# Patient Record
Sex: Male | Born: 1985 | Race: White | Hispanic: No | Marital: Single | Smoking: Never smoker
Health system: Southern US, Community
[De-identification: ages and names within clinical notes are randomized; demographics above are authoritative.]

## PROBLEM LIST (undated history)

## (undated) DIAGNOSIS — F329 Major depressive disorder, single episode, unspecified: Secondary | ICD-10-CM

## (undated) DIAGNOSIS — I10 Essential (primary) hypertension: Secondary | ICD-10-CM

## (undated) DIAGNOSIS — F909 Attention-deficit hyperactivity disorder, unspecified type: Secondary | ICD-10-CM

## (undated) DIAGNOSIS — F32A Depression, unspecified: Secondary | ICD-10-CM

## (undated) DIAGNOSIS — S43006A Unspecified dislocation of unspecified shoulder joint, initial encounter: Secondary | ICD-10-CM

## (undated) DIAGNOSIS — F840 Autistic disorder: Secondary | ICD-10-CM

## (undated) HISTORY — DX: Major depressive disorder, single episode, unspecified: F32.9

## (undated) HISTORY — DX: Essential (primary) hypertension: I10

## (undated) HISTORY — PX: FRACTURE SURGERY: SHX138

## (undated) HISTORY — DX: Attention-deficit hyperactivity disorder, unspecified type: F90.9

## (undated) HISTORY — DX: Autistic disorder: F84.0

## (undated) HISTORY — DX: Depression, unspecified: F32.A

---
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
## 2008-05-21 ENCOUNTER — Emergency Department (HOSPITAL_COMMUNITY): Admission: EM | Admit: 2008-05-21 | Discharge: 2008-05-21 | Payer: Self-pay | Admitting: Emergency Medicine

## 2010-10-17 NOTE — Op Note (Signed)
NAME:  Jimmy Ibarra, STOFKO                         ACCOUNT NO.:  [id number]   MEDICAL RECORD NO.:  [id number]                   PATIENT TYPE:  AMB   LOCATION:  DSC                                  FACILITY:  [facility]   PHYSICIAN:  Loreta Ave, M.D.              DATE OF BIRTH:  11-15-1985   DATE OF PROCEDURE:  02/02/2003  DATE OF DISCHARGE:                                 OPERATIVE REPORT   PREOPERATIVE DIAGNOSIS:  Nonunion, scaphoid, left wrist, with moderate  flexion deformity of scaphoid.   POSTOPERATIVE DIAGNOSIS:  Nonunion, scaphoid, left wrist, with moderate  flexion deformity of scaphoid, without avascular necrosis.   PROCEDURE:  Open reduction and internal fixation of nonunion of scaphoid  fracture, left wrist, with correction of deformity and fixation with a mini-  Acutrak screw 26 mm.   SURGEON:  Loreta Ave, M.D.   ASSISTANT:  Arlys John D. Petrarca, P.A.-C.   ANESTHESIA:  General.   ESTIMATED BLOOD LOSS:  Minimal.   TOURNIQUET TIME:  45 minutes.   SPECIMENS:  None.   CULTURES:  None.   COMPLICATIONS:  None.   DRESSING:  Soft compressive with thumb spica splint.   DESCRIPTION OF PROCEDURE:  Patient brought to the operating room and after  adequate anesthesia had been obtained, the left wrist examined.  Dorsiflexion to only 60 degrees because of the collapse and deformity.  Full  palmar flexion.  Tourniquet applied, prepped and draped in the usual sterile  fashion.  Exsanguinated with elevation and Esmarch and tourniquet inflated  to 250 mmHg.  A small curved incision over the volar aspect of the scaphoid.  Skin and subcutaneous tissue divided.  Neurovascular structures, tendon  units, radial artery identified and protected.  The capsule over the  scaphoid was opened.  Nonunion identified with no healing.  Both bone  fragments were viable.  Hypertrophic bone on both sides cleared off.  Deformity was corrected and then fixed with a guidewire from distal to  proximal.  Found to be in good position fluoroscopically.  Pre-drilled and  then fixed with a 26 mm Acutrak compression screw.  This corrected the  flexion deformity and also allowed good bony compression and contact, so I  did not need to add further bone graft.  At completion I saw the fixation.  Full passive motion.  Good alignment confirmed visually and  fluoroscopically.  The wound irrigated.  The wrist capsule closed with  Vicryl.  Skin with  nylon.  Margins of the wound injected with Marcaine.  A sterile compressive  dressing and thumb spica splint applied.  Tourniquet deflated and removed.  Anesthesia reversed.  Brought to the recovery room.  Tolerated the surgery  well with no complications.  Loreta Ave, M.D.    DFM/MEDQ  D:  02/01/2003  T:  02/02/2003  Job:  [id number]

## 2010-10-23 ENCOUNTER — Inpatient Hospital Stay (INDEPENDENT_AMBULATORY_CARE_PROVIDER_SITE_OTHER)
Admission: RE | Admit: 2010-10-23 | Discharge: 2010-10-23 | Disposition: A | Discharge status: Home / Self Care | Payer: 59 | Source: Ambulatory Visit | Attending: Family Medicine | Admitting: Family Medicine

## 2010-10-23 DIAGNOSIS — J309 Allergic rhinitis, unspecified: Secondary | ICD-10-CM

## 2013-04-01 ENCOUNTER — Emergency Department (HOSPITAL_COMMUNITY): Payer: Self-pay

## 2013-04-01 ENCOUNTER — Encounter (HOSPITAL_COMMUNITY): Payer: Self-pay | Admitting: Emergency Medicine

## 2013-04-01 ENCOUNTER — Emergency Department (HOSPITAL_COMMUNITY)
Admission: EM | Admit: 2013-04-01 | Discharge: 2013-04-01 | Disposition: A | Discharge status: Home / Self Care | Payer: Self-pay | Attending: Emergency Medicine | Admitting: Emergency Medicine

## 2013-04-01 ENCOUNTER — Emergency Department (HOSPITAL_COMMUNITY): Payer: 59

## 2013-04-01 DIAGNOSIS — Y9389 Activity, other specified: Secondary | ICD-10-CM | POA: Insufficient documentation

## 2013-04-01 DIAGNOSIS — W108XXA Fall (on) (from) other stairs and steps, initial encounter: Secondary | ICD-10-CM | POA: Insufficient documentation

## 2013-04-01 DIAGNOSIS — W010XXA Fall on same level from slipping, tripping and stumbling without subsequent striking against object, initial encounter: Secondary | ICD-10-CM | POA: Insufficient documentation

## 2013-04-01 DIAGNOSIS — Y929 Unspecified place or not applicable: Secondary | ICD-10-CM | POA: Insufficient documentation

## 2013-04-01 DIAGNOSIS — S43014A Anterior dislocation of right humerus, initial encounter: Secondary | ICD-10-CM

## 2013-04-01 DIAGNOSIS — S43016A Anterior dislocation of unspecified humerus, initial encounter: Secondary | ICD-10-CM | POA: Insufficient documentation

## 2013-04-01 MED ORDER — PROPOFOL 10 MG/ML IV BOLUS: 0.5000 mg/kg | Freq: Once | INTRAVENOUS | Status: DC

## 2013-04-01 MED ORDER — FENTANYL CITRATE 0.05 MG/ML IJ SOLN
100.0000 ug | Freq: Once | INTRAMUSCULAR | Status: AC
  Administered 2013-04-01: 100 ug via INTRAVENOUS

## 2013-04-01 MED ORDER — FENTANYL CITRATE 0.05 MG/ML IJ SOLN
100.0000 ug | Freq: Once | INTRAMUSCULAR | Status: AC
  Administered 2013-04-01: 100 ug via INTRAVENOUS
  Filled 2013-04-01: qty 2

## 2013-04-01 MED ORDER — OXYCODONE-ACETAMINOPHEN 5-325 MG PO TABS: 1.0000 | ORAL_TABLET | ORAL | Status: DC | PRN

## 2013-04-01 MED ORDER — PROPOFOL 10 MG/ML IV BOLUS
INTRAVENOUS | Status: AC | PRN
  Administered 2013-04-01 (×2): 30 mg via INTRAVENOUS
  Administered 2013-04-01: 50 mg via INTRAVENOUS

## 2013-04-01 MED ORDER — FENTANYL CITRATE 0.05 MG/ML IJ SOLN
INTRAMUSCULAR | Status: AC
  Filled 2013-04-01: qty 2

## 2013-04-01 MED ORDER — PROPOFOL 10 MG/ML IV BOLUS
INTRAVENOUS | Status: AC
  Filled 2013-04-01: qty 20

## 2013-04-01 NOTE — ED Notes (Signed)
Patient transported to X-ray 

## 2013-04-01 NOTE — ED Provider Notes (Signed)
27 year old male of fall down steps and injured his right shoulder and elbow. Exam shows from in the right shoulder consistent with anterior dislocation. Distal neurovascular exam is intact. He resists any movement at the elbow limiting exam. X-ray confirms anterior dislocation of the shoulder.  Procedural sedation Performed by: ZOXWR,UEAVW Consent: Verbal consent obtained. Risks and benefits: risks, benefits and alternatives were discussed Required items: required blood products, implants, devices, and special equipment available Patient identity confirmed: arm band and provided demographic data Time out: Immediately prior to procedure a "time out" was called to verify the correct patient, procedure, equipment, support staff and site/side marked as required.  Sedation type: moderate (conscious) sedation NPO time confirmed and considedered  Sedatives: PROPOFOL  Physician Time at Bedside: 30 minutes  Vitals: Vital signs were monitored during sedation. Cardiac Monitor, pulse oximeter Patient tolerance: Patient tolerated the procedure well with no immediate complications. Comments: Pt with uneventful recovered. Returned to pre-procedural sedation baseline  After informed consent was obtained, preprocedure timeout was held. He was sedated with propofol and right shoulder dislocation was reduced by manipulation. Postreduction, and shoulder deformity was no longer present and distal neurovascular exam is intact. He is placed in a shoulder immobilizer and sent for postreduction x-rays as well as x-rays of the right elbow which had not been able to be obtained earlier.  X-rays confirm reduction although L. Sacks deformity is present. I will x-ray shows no evidence of fracture. Images are viewed by me. He is discharged with prescription for oxycodone-acetaminophen and is to use OTC medications for less severe pain. He is referred to orthopedics for followup.  Medical screening  examination/treatment/procedure(s) were conducted as a shared visit with non-physician practitioner(s) and myself.  I personally evaluated the patient during the encounter.   Dione Booze, MD 04/01/13 [phone]

## 2013-04-01 NOTE — ED Provider Notes (Signed)
CSN: [id number]     Arrival date & time 04/01/13  0344 History   First MD Initiated Contact with Patient 04/01/13 0416     Chief Complaint  Patient presents with  . Arm Injury   (Consider location/radiation/quality/duration/timing/severity/associated sxs/prior Treatment) HPI Comments: Patient is a 27 year old male presenting to the emergency department for severe sharp right shoulder pain with radiation down the arm that occurred after patient slipped on stairs and fell on his shoulder earlier this evening. Patient states he lost his footing on cement steps in the rain causing him to fall. He denies any loss of consciousness, headache, vomiting. He denies any numbness in the right upper extremity. He states his pain is worsened with movement. Denies any alleviating factors. Patient is left-handed. Denies previous history of shoulder dislocations or injuries.   Patient is a 27 y.o. male presenting with arm injury.  Arm Injury Associated symptoms: no fever     History reviewed. No pertinent past medical history. Past Surgical History  Procedure Laterality Date  . Fracture surgery     No family history on file. History  Substance Use Topics  . Smoking status: Never Smoker   . Smokeless tobacco: Not on file  . Alcohol Use: Yes     Comment: socially    Review of Systems  Constitutional: Negative for fever.  Gastrointestinal: Negative for vomiting.  Musculoskeletal: Positive for arthralgias and myalgias.  Skin: Negative for wound.  Neurological: Negative for syncope and headaches.  All other systems reviewed and are negative.    Allergies  Review of patient's allergies indicates no known allergies.  Home Medications   Current Outpatient Rx  Name  Route  Sig  Dispense  Refill  . ibuprofen (ADVIL,MOTRIN) 200 MG tablet   Oral   Take 200 mg by mouth every 6 (six) hours as needed for pain.          BP 164/92  Pulse 81  Temp(Src) 98.1 F (36.7 C) (Oral)  Resp 24  Ht 6'  3" (1.905 m)  Wt 192 lb (87.091 kg)  BMI 24 kg/m2  SpO2 95% Physical Exam  Constitutional: He is oriented to person, place, and time. He appears well-developed and well-nourished. No distress.  HENT:  Head: Normocephalic and atraumatic.  Right Ear: External ear normal.  Left Ear: External ear normal.  Nose: Nose normal.  Mouth/Throat: Oropharynx is clear and moist.  Eyes: Conjunctivae are normal.  Neck: Normal range of motion. Neck supple.  Cardiovascular: Normal rate, regular rhythm, normal heart sounds and intact distal pulses.   Pulmonary/Chest: Effort normal and breath sounds normal. No respiratory distress.  Abdominal: Soft. There is no tenderness.  Musculoskeletal:       Right shoulder: He exhibits decreased range of motion, tenderness, bony tenderness, deformity, pain and decreased strength. He exhibits no effusion, no crepitus and normal pulse.       Left shoulder: Normal.       Right elbow: He exhibits decreased range of motion. Tenderness found.       Left elbow: Normal.       Right wrist: Normal.       Left wrist: Normal.       Right upper arm: He exhibits tenderness and deformity.       Right forearm: Normal.       Right hand: Normal. Normal sensation noted. Normal strength noted.  Neurological: He is alert and oriented to person, place, and time.  Skin: Skin is warm and dry. He is not  diaphoretic.  Psychiatric: He has a normal mood and affect.    ED Course  Procedures (including critical care time)  Medications  propofol (DIPRIVAN) 10 mg/mL bolus/IV push 43.6 mg (not administered)  fentaNYL (SUBLIMAZE) injection 100 mcg (100 mcg Intravenous Given 04/01/13 0440)  fentaNYL (SUBLIMAZE) injection 100 mcg (100 mcg Intravenous Given 04/01/13 0519)  propofol (DIPRIVAN) 10 mg/mL bolus/IV push ( Intravenous Stopped 04/01/13 0610)    Labs Review Labs Reviewed - No data to display Imaging Review Dg Shoulder Right  04/01/2013   CLINICAL DATA:  Fall at work with arm pain.   EXAM: RIGHT SHOULDER - 2+ VIEW  COMPARISON:  None.  FINDINGS: Anterior shoulder dislocation. There is a Hill-Sachs deformity. No evidence of acute trauma to the right chest.  IMPRESSION: Anterior shoulder dislocation with Hill-Sachs fracture.   Electronically Signed   By: Tiburcio Pea M.D.   On: 04/01/2013 05:21    EKG Interpretation   None       MDM   1. Anterior shoulder dislocation, right, initial encounter     Neurovascularly intact. Normal sensation. Patient with R anterior shoulder dislocation w/ Hill-Sachs fracture. Dr. Preston Fleeting will consciously sedate patient and reduce dislocation. Will obtain post procedure films to ensure proper reduction, along with R elbow films since patient is complaining of pain and exam is limited d/t pain. Dr. Preston Fleeting has written a procedural note for reduction. Sling immobilizer will be placed.   Post reduction films pending at time of shift change, along with R elbow films. Disposition pending post reduction films. Signed out to [employer], PA-C. Patient d/w with Dr. Preston Fleeting, agrees with plan.       Jeannetta Ellis, PA-C 04/01/13 [phone]

## 2013-04-01 NOTE — ED Notes (Signed)
Pt brought back from Xray  

## 2013-04-01 NOTE — ED Notes (Signed)
Pt states he slipped on some stairs and fell landing on his bottom with his right arm twisted behind him.  No having pain moving his arm at his elbow and shoulder

## 2013-04-01 NOTE — ED Notes (Signed)
R shoulder reduced. Pt now resting. Answers to questions.

## 2014-06-09 ENCOUNTER — Encounter (HOSPITAL_COMMUNITY): Payer: Self-pay | Admitting: Emergency Medicine

## 2014-06-09 ENCOUNTER — Emergency Department (HOSPITAL_COMMUNITY): Payer: Self-pay

## 2014-06-09 ENCOUNTER — Emergency Department (HOSPITAL_COMMUNITY)
Admission: EM | Admit: 2014-06-09 | Discharge: 2014-06-09 | Disposition: A | Discharge status: Home / Self Care | Payer: Self-pay | Attending: Emergency Medicine | Admitting: Emergency Medicine

## 2014-06-09 DIAGNOSIS — Y9289 Other specified places as the place of occurrence of the external cause: Secondary | ICD-10-CM | POA: Insufficient documentation

## 2014-06-09 DIAGNOSIS — S43004A Unspecified dislocation of right shoulder joint, initial encounter: Secondary | ICD-10-CM | POA: Insufficient documentation

## 2014-06-09 DIAGNOSIS — R52 Pain, unspecified: Secondary | ICD-10-CM

## 2014-06-09 DIAGNOSIS — Z9889 Other specified postprocedural states: Secondary | ICD-10-CM | POA: Insufficient documentation

## 2014-06-09 DIAGNOSIS — X58XXXA Exposure to other specified factors, initial encounter: Secondary | ICD-10-CM | POA: Insufficient documentation

## 2014-06-09 DIAGNOSIS — Y9389 Activity, other specified: Secondary | ICD-10-CM | POA: Insufficient documentation

## 2014-06-09 DIAGNOSIS — Y998 Other external cause status: Secondary | ICD-10-CM | POA: Insufficient documentation

## 2014-06-09 DIAGNOSIS — M25519 Pain in unspecified shoulder: Secondary | ICD-10-CM

## 2014-06-09 HISTORY — DX: Unspecified dislocation of unspecified shoulder joint, initial encounter: S43.006A

## 2014-06-09 MED ORDER — HYDROCODONE-ACETAMINOPHEN 5-325 MG PO TABS: 1.0000 | ORAL_TABLET | Freq: Four times a day (QID) | ORAL | Status: DC | PRN

## 2014-06-09 MED ORDER — MORPHINE SULFATE 4 MG/ML IJ SOLN
4.0000 mg | Freq: Once | INTRAMUSCULAR | Status: AC
  Administered 2014-06-09: 4 mg via INTRAVENOUS
  Filled 2014-06-09: qty 1

## 2014-06-09 MED ORDER — ETOMIDATE 2 MG/ML IV SOLN
0.2000 mg/kg | Freq: Once | INTRAVENOUS | Status: AC
  Administered 2014-06-09: 17 mg via INTRAVENOUS
  Filled 2014-06-09: qty 10

## 2014-06-09 MED ORDER — NAPROXEN 500 MG PO TABS: 500.0000 mg | ORAL_TABLET | Freq: Two times a day (BID) | ORAL | Status: DC

## 2014-06-09 NOTE — ED Provider Notes (Signed)
CSN: [id number]     Arrival date & time 06/09/14  0110 History   First MD Initiated Contact with Patient 06/09/14 0157     Chief Complaint  Patient presents with  . Shoulder Injury     HPI Patient presents to the emergency department complaining of pain and deformity of right shoulder.  He has a history of right shoulder dislocation.  He states that he sneezed and his right shoulder came out of joint.  He has moderate pain at this time.  He never followed up with orthopedic surgery last time.  His previous dislocation was a traumatic dislocation after fall downstairs.  Denies numbness or tingling in his right upper extremity.  Pain with range of motion and palpation of his right shoulder.  Obvious deformity noted of his right shoulder.   Past Medical History  Diagnosis Date  . Shoulder dislocation    Past Surgical History  Procedure Laterality Date  . Fracture surgery     No family history on file. History  Substance Use Topics  . Smoking status: Never Smoker   . Smokeless tobacco: Not on file  . Alcohol Use: Yes     Comment: socially    Review of Systems  All other systems reviewed and are negative.     Allergies  Review of patient's allergies indicates no known allergies.  Home Medications   Prior to Admission medications   Medication Sig Start Date End Date Taking? Authorizing Provider  HYDROcodone-acetaminophen (NORCO/VICODIN) 5-325 MG per tablet Take 1 tablet by mouth every 6 (six) hours as needed for moderate pain. 06/09/14   Lyanne Co, MD  ibuprofen (ADVIL,MOTRIN) 200 MG tablet Take 200 mg by mouth every 6 (six) hours as needed for pain.    Historical Provider, MD  naproxen (NAPROSYN) 500 MG tablet Take 1 tablet (500 mg total) by mouth 2 (two) times daily. 06/09/14   Lyanne Co, MD  oxyCODONE-acetaminophen (PERCOCET) 5-325 MG per tablet Take 1 tablet by mouth every 4 (four) hours as needed for pain. 04/01/13   Dione Booze, MD   BP 175/113 mmHg  Pulse 70   Temp(Src) 98.1 F (36.7 C) (Oral)  Resp 16  Wt 194 lb (87.998 kg)  SpO2 99% Physical Exam  Constitutional: He is oriented to person, place, and time. He appears well-developed and well-nourished.  HENT:  Head: Normocephalic and atraumatic.  Eyes: EOM are normal.  Neck: Normal range of motion.  Cardiovascular: Normal rate, regular rhythm, normal heart sounds and intact distal pulses.   Pulmonary/Chest: Effort normal and breath sounds normal. No respiratory distress.  Abdominal: Soft. He exhibits no distension. There is no tenderness.  Musculoskeletal: Normal range of motion.  Deformity of right shoulder consistent with anterior right shoulder dislocation.  Normal right radial pulse.  Normal sensation in the lateral aspect of the shoulder.  Neurological: He is alert and oriented to person, place, and time.  Skin: Skin is warm and dry.  Psychiatric: He has a normal mood and affect. Judgment normal.  Nursing note and vitals reviewed.   ED Course  Procedures    Procedural sedation Performed by: Lyanne Co Consent: Verbal consent obtained. Risks and benefits: risks, benefits and alternatives were discussed Required items: required blood products, implants, devices, and special equipment available Patient identity confirmed: arm band and provided demographic data Time out: Immediately prior to procedure a "time out" was called to verify the correct patient, procedure, equipment, support staff and site/side marked as required. Sedation type: moderate (conscious)  sedation NPO time confirmed and considedered Sedatives: ETOMIDATE Physician Time at Bedside: 15 Vitals: Vital signs were monitored during sedation. Cardiac Monitor, pulse oximeter Patient tolerance: Patient tolerated the procedure well with no immediate complications. Comments: Pt with uneventful recovered. Returned to pre-procedural sedation baseline   Reduction of dislocation Performed by: Lyanne CoAMPOS,Kodee Ravert M Consent:  Verbal consent obtained. Risks and benefits: risks, benefits and alternatives were discussed Consent given by: patient Required items: required blood products, implants, devices, and special equipment available Time out: Immediately prior to procedure a "time out" was called to verify the correct patient, procedure, equipment, support staff and site/side marked as required. Patient sedated: etomidate Vitals: Vital signs were monitored during sedation. Patient tolerance: Patient tolerated the procedure well with no immediate complications. Joint: right shoulder Reduction technique: manipulation    Labs Review Labs Reviewed - No data to display  Imaging Review Dg Shoulder Right  06/09/2014   CLINICAL DATA:  Patient sneezed at 12:30 a.m. today and dislocated is right shoulder. History of previous dislocations.  EXAM: RIGHT SHOULDER - 2+ VIEW  COMPARISON:  04/01/2013  FINDINGS: Anterior dislocation of the right humeral head with respect to the glenoid. Old appearing Hill-Sachs deformity. No definite acute fracture identified. Coracoclavicular and acromioclavicular spaces are maintained very  IMPRESSION: Anterior dislocation of the right shoulder.   Electronically Signed   By: Burman NievesWilliam  Stevens M.D.   On: 06/09/2014 01:56   Dg Shoulder Right Port  06/09/2014   CLINICAL DATA:  Postreduction right shoulder dislocation today.  EXAM: PORTABLE RIGHT SHOULDER - 2+ VIEW  COMPARISON:  06/09/2014  FINDINGS: Interval relocation of right shoulder postreduction. Hill-Sachs defect in the lateral humeral head appears to be chronic. No definite evidence of any acute fractures.  IMPRESSION: Interval relocation of previous right shoulder dislocation.   Electronically Signed   By: Burman NievesWilliam  Stevens M.D.   On: 06/09/2014 03:17  I personally reviewed the imaging tests through PACS system I reviewed available ER/hospitalization records through the EMR    EKG Interpretation None      MDM   Final diagnoses:   Shoulder pain  Shoulder dislocation, right, initial encounter   Reduction of dislocated right shoulder without complication.  Patient tolerated procedural sedation well.  Discharge home with primary care and orthopedic follow-up.  He understands to return to the ER for new or worsening symptoms.    Lyanne CoKevin M Joia Doyle, MD 06/09/14 [phone]

## 2014-06-09 NOTE — ED Notes (Signed)
Pt. Given ice pack. 

## 2014-06-09 NOTE — Sedation Documentation (Signed)
Ice Pack applied to shoulder and right clavicle.

## 2014-06-09 NOTE — Discharge Instructions (Signed)
Shoulder Dislocation  Your shoulder is made up of three bones: the collar bone (clavicle); the shoulder blade (scapula), which includes the socket (glenoid cavity); and the upper arm bone (humerus). Your shoulder joint is the place where these bones meet. Strong, fibrous tissues hold these bones together (ligaments). Muscles and strong, fibrous tissues that connect the muscles to these bones (tendons) allow your arm to move through this joint. The range of motion of your shoulder joint is more extensive than most of your other joints, and the glenoid cavity is very shallow. That is the reason that your shoulder joint is one of the most unstable joints in your body. It is far more prone to dislocation than your other joints. Shoulder dislocation is when your humerus is forced out of your shoulder joint.  CAUSES  Shoulder dislocation is caused by a forceful impact on your shoulder. This impact usually is from an injury, such as a sports injury or a fall.  SYMPTOMS  Symptoms of shoulder dislocation include:  · Deformity of your shoulder.  · Intense pain.  · Inability to move your shoulder joint.  · Numbness, weakness, or tingling around your shoulder joint (your neck or down your arm).  · Bruising or swelling around your shoulder.  DIAGNOSIS  In order to diagnose a dislocated shoulder, your caregiver will perform a physical exam. Your caregiver also may have an X-ray exam done to see if you have any broken bones. Magnetic resonance imaging (MRI) is a procedure that sometimes is done to help your caregiver see any damage to the soft tissues around your shoulder, particularly your rotator cuff tendons. Additionally, your caregiver also may have electromyography done to measure the electrical discharges produced in your muscles if you have signs or symptoms of nerve damage.  TREATMENT  A shoulder dislocation is treated by placing the humerus back in the joint (reduction). Your caregiver does this either manually (closed  reduction), by moving your humerus back into the joint through manipulation, or through surgery (open reduction). When your humerus is back in place, severe pain should improve almost immediately.  You also may need to have surgery if you have a weak shoulder joint or ligaments, and you have recurring shoulder dislocations, despite rehabilitation. In rare cases, surgery is necessary if your nerves or blood vessels are damaged during the dislocation.  After your reduction, your arm will be placed in a shoulder immobilizer or sling to keep it from moving. Your caregiver will have you wear your shoulder immobilizer or sling for 3 days to 3 weeks, depending on how serious your dislocation is. When your shoulder immobilizer or sling is removed, your caregiver may prescribe physical therapy to help improve the range of motion in your shoulder joint.  HOME CARE INSTRUCTIONS   The following measures can help to reduce pain and speed up the healing process:  · Rest your injured joint. Do not move it. Avoid activities similar to the one that caused your injury.  · Apply ice to your injured joint for the first day or two after your reduction or as directed by your caregiver. Applying ice helps to reduce inflammation and pain.  ¨ Put ice in a plastic bag.  ¨ Place a towel between your skin and the bag.  ¨ Leave the ice on for 15-20 minutes at a time, every 2 hours while you are awake.  · Exercise your hand by squeezing a soft ball. This helps to eliminate stiffness and swelling in your hand and   wrist.  · Take over-the-counter or prescription medicine for pain or discomfort as told by your caregiver.  SEEK IMMEDIATE MEDICAL CARE IF:   · Your shoulder immobilizer or sling becomes damaged.  · Your pain becomes worse rather than better.  · You lose feeling in your arm or hand, or they become white and cold.  MAKE SURE YOU:   · Understand these instructions.  · Will watch your condition.  · Will get help right away if you are not  doing well or get worse.  Document Released: 02/10/2001 Document Revised: 10/02/2013 Document Reviewed: 03/08/2011  [employer]® Patient Information ©2015 [employer]. This information is not intended to replace advice given to you by your health care provider. Make sure you discuss any questions you have with your health care provider.

## 2014-06-09 NOTE — ED Notes (Signed)
Pt alert and oriented x 4. Mother driving pt home.

## 2014-06-09 NOTE — ED Notes (Addendum)
Mother at bedside. Patient alert and oriented x 4. NSR 72 on cardiac monitor.

## 2014-06-09 NOTE — ED Notes (Addendum)
Pt states he was lying flat on stomach arms straight up and sneezed, deformity noted to R shoulder.  Pt states he has had dislocation in the past, Diprivan did not work, he was conscious for entire procedure.

## 2017-01-19 ENCOUNTER — Emergency Department (HOSPITAL_COMMUNITY)
Admission: EM | Admit: 2017-01-19 | Discharge: 2017-01-20 | Disposition: A | Discharge status: Home / Self Care | Payer: Self-pay | Attending: Emergency Medicine | Admitting: Emergency Medicine

## 2017-01-19 ENCOUNTER — Encounter (HOSPITAL_COMMUNITY): Payer: Self-pay | Admitting: *Deleted

## 2017-01-19 ENCOUNTER — Emergency Department (HOSPITAL_COMMUNITY): Payer: Self-pay

## 2017-01-19 DIAGNOSIS — Y9389 Activity, other specified: Secondary | ICD-10-CM | POA: Insufficient documentation

## 2017-01-19 DIAGNOSIS — S43004A Unspecified dislocation of right shoulder joint, initial encounter: Secondary | ICD-10-CM | POA: Insufficient documentation

## 2017-01-19 DIAGNOSIS — X509XXA Other and unspecified overexertion or strenuous movements or postures, initial encounter: Secondary | ICD-10-CM | POA: Insufficient documentation

## 2017-01-19 DIAGNOSIS — Y929 Unspecified place or not applicable: Secondary | ICD-10-CM | POA: Insufficient documentation

## 2017-01-19 DIAGNOSIS — Y998 Other external cause status: Secondary | ICD-10-CM | POA: Insufficient documentation

## 2017-01-19 MED ORDER — HYDROMORPHONE HCL 1 MG/ML IJ SOLN
1.0000 mg | Freq: Once | INTRAMUSCULAR | Status: AC
  Administered 2017-01-19: 1 mg via INTRAVENOUS
  Filled 2017-01-19: qty 1

## 2017-01-19 MED ORDER — FENTANYL CITRATE (PF) 100 MCG/2ML IJ SOLN
50.0000 ug | Freq: Once | INTRAMUSCULAR | Status: AC
  Administered 2017-01-19: 50 ug via INTRAVENOUS
  Filled 2017-01-19: qty 2

## 2017-01-19 MED ORDER — FENTANYL CITRATE (PF) 100 MCG/2ML IJ SOLN
50.0000 ug | INTRAMUSCULAR | Status: DC | PRN
  Administered 2017-01-19: 50 ug via NASAL

## 2017-01-19 MED ORDER — HYDROMORPHONE HCL 1 MG/ML IJ SOLN
1.0000 mg | Freq: Once | INTRAMUSCULAR | Status: AC
Start: 2017-01-19 — End: 2017-01-19
  Administered 2017-01-19: 1 mg via INTRAMUSCULAR
  Filled 2017-01-19: qty 1

## 2017-01-19 MED ORDER — PROPOFOL 10 MG/ML IV BOLUS
0.5000 mg/kg | Freq: Once | INTRAVENOUS | Status: DC
  Filled 2017-01-19: qty 20

## 2017-01-19 MED ORDER — FENTANYL CITRATE (PF) 100 MCG/2ML IJ SOLN
INTRAMUSCULAR | Status: AC
  Administered 2017-01-19: 50 ug via NASAL
  Filled 2017-01-19: qty 2

## 2017-01-19 NOTE — ED Notes (Signed)
Warm blanket applied.  Patient relaxing more and more, states pain is reducing.

## 2017-01-19 NOTE — ED Notes (Signed)
MD at bedside. 

## 2017-01-19 NOTE — ED Notes (Signed)
Code cart, airway cart at bedside.  Signed consent complete.  Pt placed on CO2 monitor.

## 2017-01-19 NOTE — ED Notes (Signed)
Patient screaming out of room.  IV became dislodged and trickled water in to bed.  Pt continues to endorse 9/10 pain.

## 2017-01-19 NOTE — ED Provider Notes (Signed)
[facility]-EMERGENCY DEPT Provider Note   CSN: [id number] Arrival date & time: 01/19/17  1859     History   Chief Complaint Chief Complaint  Patient presents with  . Shoulder Pain    HPI Jimmy Ibarra is a 31 y.o. male.  Patient presents with right shoulder dislocation.  He states he was lying on his stomach with his arms outstretched above his head when he sneezed, he felt a pop in his right shoulder and decreased range of motion He states in January 2016 he dislocated his shoulderin the same manner.      Past Medical History:  Diagnosis Date  . Shoulder dislocation     There are no active problems to display for this patient.   Past Surgical History:  Procedure Laterality Date  . FRACTURE SURGERY         Home Medications    Prior to Admission medications   Medication Sig Start Date End Date Taking? Authorizing Provider  ibuprofen (ADVIL,MOTRIN) 200 MG tablet Take 200 mg by mouth every 6 (six) hours as needed for pain.   Yes [provider]  HYDROcodone-acetaminophen (NORCO/VICODIN) 5-325 MG per tablet Take 1 tablet by mouth every 6 (six) hours as needed for moderate pain. Patient not taking: Reported on 01/19/2017 06/09/14   Azalia Bilis, MD  naproxen (NAPROSYN) 500 MG tablet Take 1 tablet (500 mg total) by mouth 2 (two) times daily. Patient not taking: Reported on 01/19/2017 06/09/14   Azalia Bilis, MD  oxyCODONE-acetaminophen (PERCOCET) 5-325 MG per tablet Take 1 tablet by mouth every 4 (four) hours as needed for pain. Patient not taking: Reported on 06/09/2014 04/01/13   Dione Booze, MD    Family History No family history on file.  Social History Social History  Substance Use Topics  . Smoking status: Never Smoker  . Smokeless tobacco: Not on file  . Alcohol use Yes     Comment: socially     Allergies   Patient has no known allergies.   Review of Systems Review of Systems  Musculoskeletal: Positive for joint swelling.  Neurological:  Negative for numbness.  All other systems reviewed and are negative.    Physical Exam Updated Vital Signs BP (!) 188/140   Pulse 85   Temp 98.6 F (37 C) (Oral)   Resp 15   Ht 6\' 2"  (1.88 m)   Wt 90.7 kg (200 lb)   SpO2 97%   BMI 25.68 kg/m   Physical Exam  Constitutional: He appears well-developed and well-nourished.  HENT:  Head: Normocephalic.  Eyes: Pupils are equal, round, and reactive to light.  Neck: Normal range of motion.  Cardiovascular: Normal rate.   Pulmonary/Chest: Effort normal.  Musculoskeletal: He exhibits tenderness and deformity.       Arms: Neurological: He is alert.  Skin: Skin is warm. He is diaphoretic.  Nursing note and vitals reviewed.    ED Treatments / Results  Labs (all labs ordered are listed, but only abnormal results are displayed) Labs Reviewed - No data to display  EKG  EKG Interpretation None       Radiology Dg Shoulder Right  Result Date: 01/20/2017 CLINICAL DATA:  Post reduction radiographs of the right shoulder status post dislocation. EXAM: RIGHT SHOULDER - 2+ VIEW COMPARISON:  01/19/2017 FINDINGS: Satisfactory reduction of the humeral head which articulates with the glenoid. The [facility] joint is maintained. Large Hill-Sachs deformity is noted. No bony Bankart lesion is identified. The adjacent ribs and lung are nonacute. IMPRESSION: Satisfactory reduction  of humeral head dislocation. Hill-Sachs deformity of the humeral head is noted. No bony Bankart lesions are apparent. Electronically Signed   By: Tollie Eth M.D.   On: 01/20/2017 01:09   Dg Shoulder Right  Result Date: 01/19/2017 CLINICAL DATA:  Pain and possible right shoulder dislocation. EXAM: RIGHT SHOULDER - 2+ VIEW COMPARISON:  06/09/2014 FINDINGS: There is a right anterior dislocation of the humeral head with respect to the glenoid. Chronic appearing Hill-Sachs deformity. No definite acute fracture seen. Coracoclavicular and acromioclavicular spaces are maintained.  IMPRESSION: Anterior right shoulder dislocation. Chronic appearing Hill-Sachs deformity. No definite acute fractures seen. Electronically Signed   By: Ted Mcalpine M.D.   On: 01/19/2017 19:47    Procedures Reduction of dislocation Date/Time: 01/20/2017 12:14 AM Performed by: Earley Favor Authorized by: Earley Favor  Consent: Verbal consent obtained. Consent given by: patient Patient understanding: patient states understanding of the procedure being performed Patient consent: the patient's understanding of the procedure matches consent given Procedure consent: procedure consent matches procedure scheduled Relevant documents: relevant documents present and verified Test results: test results available and properly labeled Site marked: the operative site was marked Imaging studies: imaging studies available Patient identity confirmed: verbally with patient Time out: Immediately prior to procedure a "time out" was called to verify the correct patient, procedure, equipment, support staff and site/side marked as required. Local anesthesia used: no  Anesthesia: Local anesthesia used: no  Sedation: Patient sedated: yes Sedatives: propofol Analgesia: hydromorphone Sedation start date/time: 01/20/2017 12:11 AM Sedation end date/time: 01/20/2017 12:18 AM Vitals: Vital signs were monitored during sedation. Patient tolerance: Patient tolerated the procedure well with no immediate complications    (including critical care time)  Medications Ordered in ED Medications  fentaNYL (SUBLIMAZE) injection 50 mcg (50 mcg Intravenous Given 01/19/17 2013)  HYDROmorphone (DILAUDID) injection 1 mg (1 mg Intravenous Given 01/19/17 2059)  HYDROmorphone (DILAUDID) injection 1 mg (1 mg Intramuscular Given 01/19/17 2148)  propofol (DIPRIVAN) 10 mg/mL bolus/IV push (100 mg Intravenous Given 01/20/17 0008)     Initial Impression / Assessment and Plan / ED Course  I have reviewed the triage vital signs  and the nursing notes.  Pertinent labs & imaging results that were available during my care of the patient were reviewed by me and considered in my medical decision making (see chart for details).        Final Clinical Impressions(s) / ED Diagnoses   Final diagnoses:  Shoulder dislocation, right, initial encounter    New Prescriptions Discharge Medication List as of 01/20/2017  1:26 AM       Earley Favor, NP 01/20/17 1944    Loren Racer, MD 01/22/17 1515

## 2017-01-19 NOTE — ED Triage Notes (Signed)
Pt states that he sneezed and he dislocated his right shoulder. Prev history of 2 prior shoulder dislocations. Pt took what he thinks was ibuprofen for pain PTA without relief.

## 2017-01-19 NOTE — ED Notes (Signed)
With NP present, patient turned on to stomach and saline bag with curlex around pt's right forearm, positioned patient with shoulder at line of bed, and hand not touching the ground.  Pt's mother in room at this time, and assisting patient to relax trapezius muscle.

## 2017-01-20 ENCOUNTER — Emergency Department (HOSPITAL_COMMUNITY): Payer: Self-pay

## 2017-01-20 MED ORDER — PROPOFOL 10 MG/ML IV BOLUS
INTRAVENOUS | Status: AC | PRN
  Administered 2017-01-20: 100 mg via INTRAVENOUS

## 2017-01-20 NOTE — ED Notes (Signed)
Patient able to ambulate independently  

## 2017-01-20 NOTE — ED Notes (Signed)
Pt at xray

## 2017-01-20 NOTE — Discharge Instructions (Signed)
It is important that you see the orthopedic doctor to discuss surgical repair of your should as it has been dislocated several times

## 2017-01-20 NOTE — ED Notes (Signed)
Patient's mother back at bedside.  Updated.

## 2018-03-21 ENCOUNTER — Encounter (HOSPITAL_COMMUNITY): Payer: Self-pay

## 2018-03-21 ENCOUNTER — Encounter (HOSPITAL_COMMUNITY): Payer: Self-pay | Admitting: Emergency Medicine

## 2018-03-21 ENCOUNTER — Other Ambulatory Visit: Payer: Self-pay

## 2018-03-21 ENCOUNTER — Emergency Department (HOSPITAL_COMMUNITY)
Admission: EM | Admit: 2018-03-21 | Discharge: 2018-03-21 | Disposition: A | Discharge status: Home / Self Care | Payer: Self-pay | Attending: Emergency Medicine | Admitting: Emergency Medicine

## 2018-03-21 ENCOUNTER — Ambulatory Visit (HOSPITAL_COMMUNITY)
Admission: EM | Admit: 2018-03-21 | Discharge: 2018-03-21 | Disposition: A | Discharge status: Home / Self Care | Payer: Self-pay

## 2018-03-21 DIAGNOSIS — I10 Essential (primary) hypertension: Secondary | ICD-10-CM | POA: Insufficient documentation

## 2018-03-21 DIAGNOSIS — Z79899 Other long term (current) drug therapy: Secondary | ICD-10-CM | POA: Insufficient documentation

## 2018-03-21 LAB — BASIC METABOLIC PANEL
Anion gap: 9 (ref 5–15)
BUN: 12 mg/dL (ref 6–20)
CALCIUM: 9.1 mg/dL (ref 8.9–10.3)
CO2: 30 mmol/L (ref 22–32)
CREATININE: 1.05 mg/dL (ref 0.61–1.24)
Chloride: 100 mmol/L (ref 98–111)
GFR calc Af Amer: >60 mL/min (ref >60)
GLUCOSE: 100 mg/dL — AB (ref 70–99)
POTASSIUM: 3.2 mmol/L — AB (ref 3.5–5.1)
SODIUM: 139 mmol/L (ref 135–145)

## 2018-03-21 LAB — CBC
HCT: 47.1 % (ref 39.0–52.0)
Hemoglobin: 16.2 g/dL (ref 13.0–17.0)
MCH: 29.1 pg (ref 26.0–34.0)
MCHC: 34.4 g/dL (ref 30.0–36.0)
MCV: 84.7 fL (ref 80.0–100.0)
PLATELETS: 268 10*3/uL (ref 150–400)
RBC: 5.56 MIL/uL (ref 4.22–5.81)
RDW: 12.5 % (ref 11.5–15.5)
WBC: 8.7 10*3/uL (ref 4.0–10.5)
nRBC: 0 % (ref 0.0–0.2)

## 2018-03-21 MED ORDER — AMLODIPINE BESYLATE 5 MG PO TABS
2.5000 mg | ORAL_TABLET | Freq: Once | ORAL | Status: AC
Start: 2018-03-21 — End: 2018-03-21
  Administered 2018-03-21: 2.5 mg via ORAL
  Filled 2018-03-21: qty 1

## 2018-03-21 MED ORDER — AMLODIPINE BESYLATE 5 MG PO TABS: 5.0000 mg | ORAL_TABLET | Freq: Every day | ORAL | 0 refills | Status: DC

## 2018-03-21 NOTE — Progress Notes (Signed)
CSW provided pt with information for the [facility]. CSW reviewed calling there tomorrow morning stating he was seen in the ED tonight.   Montine Circle, [facility]  [phone]

## 2018-03-21 NOTE — ED Provider Notes (Signed)
32 year old male comes in for hypertension.  States he was seeing his eye doctor, who told him there is "blood behind my eyes due to blood pressure" and was sent here for evaluation.  Patient has written note from ophthalmology office.  Was told he has hypertensive crisis, with retinal hemorrhage.  BP of 198/132 here in office.  Discussed with patient, will need further evaluation at the emergency department.  Discharged in stable condition to the emergency department for further evaluation.   Belinda Fisher, PA-C 03/21/18 1601

## 2018-03-21 NOTE — ED Notes (Signed)
Discharged to [facility] by provider

## 2018-03-21 NOTE — ED Triage Notes (Signed)
Pt here for having higher BP at an eye doctors appointment.  190s systolic.  209/161 in triage.  Denies any blurred vision or dizziness. No other medical hx.

## 2018-03-21 NOTE — ED Notes (Signed)
Patient left at this time with all belongings. 

## 2018-03-21 NOTE — ED Triage Notes (Signed)
Pt states he went to the eye doctor and his blood pressure was high, they said he has "blood behind my eyes due to my blood pressure". Denies headaches.

## 2018-03-21 NOTE — ED Provider Notes (Signed)
[facility] EMERGENCY DEPARTMENT Provider Note   CSN: [id number] Arrival date & time: 03/21/18  1608     History   Chief Complaint Chief Complaint  Patient presents with  . Hypertension    HPI Jimmy Ibarra is a 32 y.o. male.  HPI She is a 32 year old male with no significant past medical history who presents emergency department for evaluation of elevated blood pressure.  Patient was seen last week by an optometrist who told him he had findings concerning for retinal hemorrhages and as result was sent to a retinal specialist.  He was seen by the retinal specialist earlier today at which time he had findings consistent with an acute retinal hemorrhage on exam.  As result patient sent to the emergency department for further evaluation and care.  On arrival to the emergency department, patient has no complaints.  Denies headache, changes in vision, chest pain, shortness of breath, nausea, vomiting, or flank pain.  Remaining review of systems negative at this time.  Past Medical History:  Diagnosis Date  . ADHD   . Autism spectrum disorder   . Depressive disorder   . History of Shoulder dislocation     There are no active problems to display for this patient.   Past Surgical History:  Procedure Laterality Date  . FRACTURE SURGERY          Home Medications    Prior to Admission medications   Medication Sig Start Date End Date Taking? Authorizing Provider  ibuprofen (ADVIL,MOTRIN) 200 MG tablet Take 400 mg by mouth every 6 (six) hours as needed for headache.    Yes [provider]  Omega-3 Fatty Acids (FISH OIL ADULT GUMMIES PO) Take 2 tablets by mouth daily.   Yes [provider]  amLODipine (NORVASC) 5 MG tablet Take 1 tablet (5 mg total) by mouth daily for 14 days. 03/21/18 04/04/18  Sharolyn Weber, Winfield Rast, MD    Family History History reviewed. No pertinent family history.  Social History Social History   Tobacco Use  . Smoking  status: Never Smoker  Substance Use Topics  . Alcohol use: Yes    Comment: socially  . Drug use: No     Allergies   Patient has no known allergies.   Review of Systems Review of Systems  Constitutional: Negative for chills and fever.  HENT: Negative for ear pain and sore throat.   Eyes: Negative for photophobia, pain and visual disturbance.  Respiratory: Negative for cough and shortness of breath.   Cardiovascular: Negative for chest pain and palpitations.  Gastrointestinal: Negative for abdominal pain and vomiting.  Genitourinary: Negative for dysuria, flank pain and hematuria.  Musculoskeletal: Negative for arthralgias and back pain.  Skin: Negative for color change and rash.  Neurological: Negative for dizziness, seizures, syncope, speech difficulty, weakness, light-headedness, numbness and headaches.  Psychiatric/Behavioral: Negative for agitation, behavioral problems and confusion.  All other systems reviewed and are negative.    Physical Exam Updated Vital Signs BP (!) 191/140   Pulse 64   Temp 98.1 F (36.7 C)   Resp 19   SpO2 97%   Physical Exam  Constitutional: He is oriented to person, place, and time. He appears well-developed and well-nourished.  HENT:  Head: Normocephalic and atraumatic.  Eyes: Pupils are equal, round, and reactive to light. Conjunctivae are normal.  Neck: Normal range of motion. Neck supple.  Cardiovascular: Normal rate and regular rhythm.  No murmur heard. Pulmonary/Chest: Effort normal and breath sounds normal. No  respiratory distress.  Abdominal: Soft. There is no tenderness.  Musculoskeletal: He exhibits no edema.  Neurological: He is alert and oriented to person, place, and time. No cranial nerve deficit or sensory deficit. He exhibits normal muscle tone. Coordination normal.  Normal gait, normal finger-to-nose, normal heel-to-shin, negative Romberg.  Normal extraocular movements.  No photophobia.  No visual field deficits.  Skin:  Skin is warm and dry.  Psychiatric: He has a normal mood and affect.  Nursing note and vitals reviewed.    ED Treatments / Results  Labs (all labs ordered are listed, but only abnormal results are displayed) Labs Reviewed  BASIC METABOLIC PANEL - Abnormal; Notable for the following components:      Result Value   Potassium 3.2 (*)    Glucose, Bld 100 (*)    All other components within normal limits  CBC    EKG None  Radiology No results found.  Procedures Procedures (including critical care time)  Medications Ordered in ED Medications  amLODipine (NORVASC) tablet 2.5 mg (2.5 mg Oral Given 03/21/18 2045)     Initial Impression / Assessment and Plan / ED Course  I have reviewed the triage vital signs and the nursing notes.  Pertinent labs & imaging results that were available during my care of the patient were reviewed by me and considered in my medical decision making (see chart for details).     Patient is a 32 year old male with no significant past medical problems who presents the emergency department for evaluation of hypertension.  Patient was seen by an outside optometrist who noticed retinal hemorrhages on exam in the setting of elevated blood pressure, and as a result patient was brought to the emergency department for further evaluation.  Upon arrival patient has no complaints.  Specifically no visual changes no headaches, no chest pain, and no shortness of breath.  Given his elevated blood pressure here labs were obtained and are overall reassuring.  Patient's physical exam is unremarkable.  As a result of patient's hypertension he was given a dose of amlodipine while in the emergency department.  After reviewing patient's chart it appears that he has been hypertensive for some time.  As a result I do not believe patient requires admission for his elevated blood pressure at this time.  He will be given a prescription for amlodipine as an outpatient and given resources  to follow-up in the next few days for reevaluation.  Strict return precautions given prior to discharge.  Patient no acute distress at time of discharge.  The care of this patient was discussed with my attending physician Dr. Anitra Lauth, who voices agreement with work-up and ED disposition.  Final Clinical Impressions(s) / ED Diagnoses   Final diagnoses:  Hypertension, unspecified type    ED Discharge Orders         Ordered    amLODipine (NORVASC) 5 MG tablet  Daily     03/21/18 2223           Keith Rake, MD 03/22/18 1550    Gwyneth Sprout, MD 03/28/18 2106

## 2018-03-23 ENCOUNTER — Ambulatory Visit (INDEPENDENT_AMBULATORY_CARE_PROVIDER_SITE_OTHER): Payer: Self-pay | Admitting: Family Medicine

## 2018-03-23 ENCOUNTER — Encounter: Payer: Self-pay | Admitting: Family Medicine

## 2018-03-23 VITALS — BP 170/120 | HR 74 | Temp 98.0°F | Resp 17 | Ht 73.0 in | Wt 203.8 lb

## 2018-03-23 DIAGNOSIS — E876 Hypokalemia: Secondary | ICD-10-CM

## 2018-03-23 DIAGNOSIS — H3562 Retinal hemorrhage, left eye: Secondary | ICD-10-CM

## 2018-03-23 DIAGNOSIS — F840 Autistic disorder: Secondary | ICD-10-CM

## 2018-03-23 DIAGNOSIS — I10 Essential (primary) hypertension: Secondary | ICD-10-CM

## 2018-03-23 MED ORDER — AMLODIPINE BESYLATE 10 MG PO TABS: 10.0000 mg | ORAL_TABLET | Freq: Every day | ORAL | 3 refills | Status: DC

## 2018-03-23 MED ORDER — CLONIDINE HCL 0.1 MG PO TABS
0.1000 mg | ORAL_TABLET | Freq: Once | ORAL | Status: AC
  Administered 2018-03-23: 0.1 mg via ORAL

## 2018-03-23 NOTE — Patient Instructions (Addendum)
Go to [facility] to have lab work completed; [facility]  [address] [place], [place] [address] [phone]   I am increasing your Amlodipine 10 mg daily which you will start this dose tomorrow.   Managing Your Hypertension Hypertension is commonly called high blood pressure. This is when the force of your blood pressing against the walls of your arteries is too strong. Arteries are blood vessels that carry blood from your heart throughout your body. Hypertension forces the heart to work harder to pump blood, and may cause the arteries to become narrow or stiff. Having untreated or uncontrolled hypertension can cause heart attack, stroke, kidney disease, and other problems. What are blood pressure readings? A blood pressure reading consists of a higher number over a lower number. Ideally, your blood pressure should be below 120/80. The first ("top") number is called the systolic pressure. It is a measure of the pressure in your arteries as your heart beats. The second ("bottom") number is called the diastolic pressure. It is a measure of the pressure in your arteries as the heart relaxes. What does my blood pressure reading mean? Blood pressure is classified into four stages. Based on your blood pressure reading, your health care provider may use the following stages to determine what type of treatment you need, if any. Systolic pressure and diastolic pressure are measured in a unit called mm Hg. Normal  Systolic pressure: below 120.  Diastolic pressure: below 80. Elevated  Systolic pressure: 120-129.  Diastolic pressure: below 80. Hypertension stage 1  Systolic pressure: 130-139.  Diastolic pressure: 80-89. Hypertension stage 2  Systolic pressure: 140 or above.  Diastolic pressure: 90 or above. What health risks are associated with hypertension? Managing your hypertension is an important responsibility. Uncontrolled hypertension can lead  to:  A heart attack.  A stroke.  A weakened blood vessel (aneurysm).  Heart failure.  Kidney damage.  Eye damage.  Metabolic syndrome.  Memory and concentration problems.  What changes can I make to manage my hypertension? Hypertension can be managed by making lifestyle changes and possibly by taking medicines. Your health care provider will help you make a plan to bring your blood pressure within a normal range. Eating and drinking  Eat a diet that is high in fiber and potassium, and low in salt (sodium), added sugar, and fat. An example eating plan is called the DASH (Dietary Approaches to Stop Hypertension) diet. To eat this way: ? Eat plenty of fresh fruits and vegetables. Try to fill half of your plate at each meal with fruits and vegetables. ? Eat whole grains, such as whole wheat pasta, brown rice, or whole grain bread. Fill about one quarter of your plate with whole grains. ? Eat low-fat diary products. ? Avoid fatty cuts of meat, processed or cured meats, and poultry with skin. Fill about one quarter of your plate with lean proteins such as fish, chicken without skin, beans, eggs, and tofu. ? Avoid premade and processed foods. These tend to be higher in sodium, added sugar, and fat.  Reduce your daily sodium intake. Most people with hypertension should eat less than 1,500 mg of sodium a day.  Limit alcohol intake to no more than 1 drink a day for nonpregnant women and 2 drinks a day for men. One drink equals 12 oz of beer, 5 oz of wine, or 1 oz of hard liquor. Lifestyle  Work with your health care provider to maintain a healthy body weight, or  to lose weight. Ask what an ideal weight is for you.  Get at least 30 minutes of exercise that causes your heart to beat faster (aerobic exercise) most days of the week. Activities may include walking, swimming, or biking.  Include exercise to strengthen your muscles (resistance exercise), such as weight lifting, as part of your  weekly exercise routine. Try to do these types of exercises for 30 minutes at least 3 days a week.  Do not use any products that contain nicotine or tobacco, such as cigarettes and e-cigarettes. If you need help quitting, ask your health care provider.  Control any long-term (chronic) conditions you have, such as high cholesterol or diabetes. Monitoring  Monitor your blood pressure at home as told by your health care provider. Your personal target blood pressure may vary depending on your medical conditions, your age, and other factors.  Have your blood pressure checked regularly, as often as told by your health care provider. Working with your health care provider  Review all the medicines you take with your health care provider because there may be side effects or interactions.  Talk with your health care provider about your diet, exercise habits, and other lifestyle factors that may be contributing to hypertension.  Visit your health care provider regularly. Your health care provider can help you create and adjust your plan for managing hypertension. Will I need medicine to control my blood pressure? Your health care provider may prescribe medicine if lifestyle changes are not enough to get your blood pressure under control, and if:  Your systolic blood pressure is 130 or higher.  Your diastolic blood pressure is 80 or higher.  Take medicines only as told by your health care provider. Follow the directions carefully. Blood pressure medicines must be taken as prescribed. The medicine does not work as well when you skip doses. Skipping doses also puts you at risk for problems. Contact a health care provider if:  You think you are having a reaction to medicines you have taken.  You have repeated (recurrent) headaches.  You feel dizzy.  You have swelling in your ankles.  You have trouble with your vision. Get help right away if:  You develop a severe headache or confusion.  You  have unusual weakness or numbness, or you feel faint.  You have severe pain in your chest or abdomen.  You vomit repeatedly.  You have trouble breathing. Summary  Hypertension is when the force of blood pumping through your arteries is too strong. If this condition is not controlled, it may put you at risk for serious complications.  Your personal target blood pressure may vary depending on your medical conditions, your age, and other factors. For most people, a normal blood pressure is less than 120/80.  Hypertension is managed by lifestyle changes, medicines, or both. Lifestyle changes include weight loss, eating a healthy, low-sodium diet, exercising more, and limiting alcohol. This information is not intended to replace advice given to you by your health care provider. Make sure you discuss any questions you have with your health care provider. Document Released: 02/10/2012 Document Revised: 04/15/2016 Document Reviewed: 04/15/2016 Elsevier Interactive Patient Education  [employer].

## 2018-03-23 NOTE — Progress Notes (Signed)
Jimmy Ibarra, is a 32 y.o. male  Jimmy Ibarra  Jimmy Ibarra  DOB - 11-22-1985  CC:  Chief Complaint  Patient presents with  . Establish Care  . Follow-up    ED 10/21 for elevated BP & acute retinal hemorrhage       HPI: Jimmy Ibarra is a 32 y.o. male is here today to establish care.   Jimmy Ibarra [name] has accelerated hypertension and retinal hemorrhage , acute, autism spectrum disorder on his problem list.   Today's visit: ER Follow-up  Jimmy Ibarra [name] patient was referred here after being evaluated by ER for accelerated hypertension 03/21/18. Patient reports while being evaluated for a routine eye exam he noted to have a retinal hemorrhage. He was sent to retinal specialist to the ER as his blood pressure was elevated. BP on arrival was 191/140. He was administered Norvasc 2.5 mg and discharged with a prescription for Norvasc 5 mg daily. Wisdom denies dizziness, chest pain, headaches, shortness of breath, or edema. He is followed by [name] for management of opthalmology management. His next follow-up is scheduled for December 2. Current medications: Current Outpatient Medications:  .  amLODipine (NORVASC) 5 MG tablet, Take 1 tablet (5 mg total) by mouth daily for 14 days., Disp: 14 tablet, Rfl: 0 .  ibuprofen (ADVIL,MOTRIN) 200 MG tablet, Take 400 mg by mouth every 6 (six) hours as needed for headache. , Disp: , Rfl:  .  Omega-3 Fatty Acids (FISH OIL ADULT GUMMIES PO), Take 2 tablets by mouth daily., Disp: , Rfl:   Current Facility-Administered Medications:  .  cloNIDine (CATAPRES) tablet 0.1 mg, 0.1 mg, Oral, Once, [name], [name], FNP   Pertinent family medical history: family history is not on file.   No Known Allergies  Social History   Socioeconomic History  . Marital status: Married    Spouse name: Not on file  . Number of children: Not on file  . Years of education: Not on file  . Highest education level: Not on file  Occupational History  .  Not on file  Social Needs  . Financial resource strain: Not on file  . Food insecurity:    Worry: Not on file    Inability: Not on file  . Transportation needs:    Medical: Not on file    Non-medical: Not on file  Tobacco Use  . Smoking status: Never Smoker  . Smokeless tobacco: Never Used  Substance and Sexual Activity  . Alcohol use: Yes    Comment: socially  . Drug use: No  . Sexual activity: Not on file  Lifestyle  . Physical activity:    Days per week: Not on file    Minutes per session: Not on file  . Stress: Not on file  Relationships  . Social connections:    Talks on phone: Not on file    Gets together: Not on file    Attends religious service: Not on file    Active member of club or organization: Not on file    Attends meetings of clubs or organizations: Not on file    Relationship status: Not on file  . Intimate partner violence:    Fear of current or ex partner: Not on file    Emotionally abused: Not on file    Physically abused: Not on file    Forced sexual activity: Not on file  Other Topics Concern  . Not on file  Social History Narrative  . Not on file  Review of Systems: Constitutional: Negative for fever, chills, diaphoresis, activity change, appetite change and fatigue. HENT: Negative for ear pain, nosebleeds, congestion, facial swelling, rhinorrhea, neck pain, neck stiffness and ear discharge.  Eyes: Negative for pain, discharge, redness, itching and visual disturbance. Respiratory: Negative for cough, choking, chest tightness, shortness of breath, wheezing and stridor.  Cardiovascular: Negative for chest pain, palpitations and leg swelling. Neurological: Negative for dizziness, tremors, seizures, syncope, facial asymmetry, speech difficulty, weakness, light-headedness, numbness and headaches.  Hematological: Negative for adenopathy. Does not bruise/bleed easily.    Objective:   Vitals:   03/23/18 1000 03/23/18 1038  BP: (!) 182/139 (!)  170/120  Pulse: 74   Resp: 17   Temp: 98 F (36.7 C)   SpO2: 97%     BP Readings from Last 3 Encounters:  03/23/18 (!) 170/120  03/21/18 (!) 191/140  03/21/18 (!) 198/132    Filed Weights   03/23/18 1000  Weight: 203 lb 12.8 oz (92.4 kg)      Physical Exam: Constitutional: Patient appears well-developed and well-nourished. No distress. HENT: Normocephalic, atraumatic, External right and left ear normal. Oropharynx is clear and moist.  Eyes: Conjunctivae and EOM are normal. PERRLA. Neck: Normal ROM. Neck supple. No JVD. No tracheal deviation. No thyromegaly. CVS: RRR, S1/S2 +, no murmurs, no gallops, no carotid bruit.  Pulmonary: Effort and breath sounds normal, no stridor, rhonchi, wheezes, rales.  Musculoskeletal: Normal range of motion. No edema and no tenderness.  Neuro: Alert. Normal muscle tone coordination. Normal gait. BUE and BLE strength 5/5. Bilateral hand grips symmetrical. Skin: Skin is warm and dry. No rash noted. Not diaphoretic. No erythema. No pallor. Psychiatric: Normal mood and affect. Behavior, judgment, thought content normal.  Lab Results (prior encounters)  Lab Results  Component Value Date   WBC 8.7 03/21/2018   HGB 16.2 03/21/2018   HCT 47.1 03/21/2018   MCV 84.7 03/21/2018   PLT 268 03/21/2018   Lab Results  Component Value Date   CREATININE 1.05 03/21/2018   BUN 12 03/21/2018   NA 139 03/21/2018   K 3.2 (L) 03/21/2018   CL 100 03/21/2018   CO2 30 03/21/2018      Assessment and plan:  1. Accelerated hypertension, unknown etiology, extremely hypertension x 3 readings today. -Increasing amlodipine 10 mg once daily. Given  cloNIDine (CATAPRES) tablet 0.1 mg today. Patient stable and asymptomatic . If BP remains excessively elevated , will add HCTZ. If no improvement , refer to cardiology.   Return lab appt tomorrow and BP f/u Monday.  Meds ordered this encounter  Medications  . cloNIDine (CATAPRES) tablet 0.1 mg  . amLODipine (NORVASC)  10 MG tablet    Sig: Take 1 tablet (10 mg total) by mouth daily.    Dispense:  90 tablet    Refill:  3   Orders Placed This Encounter  Procedures  . Thyroid Panel With TSH    Standing Status:   Future    Number of Occurrences:   1    Standing Expiration Date:   03/24/2019  . Hemoglobin A1c    Standing Status:   Future    Number of Occurrences:   1    Standing Expiration Date:   03/24/2019  . Potassium    Standing Status:   Future    Number of Occurrences:   1    Standing Expiration Date:   03/24/2019    The patient was given clear instructions to go to ER or return to medical center  if symptoms don't improve, worsen or new problems develop. The patient verbalized understanding. The patient was advised  to call and obtain lab results if they haven't heard anything from out office within 7-10 business days.  Joaquin Courts, FNP Primary Care at [facility] [address], [place] [address] [phone]: [phone]    A total of 30 minutes spent, greater than 50 % of this time was spent counseling and coordination of care.  This note has been created with Education officer, environmental. Any transcriptional errors are unintentional.

## 2018-03-24 ENCOUNTER — Ambulatory Visit: Payer: Self-pay | Attending: Family Medicine

## 2018-03-24 DIAGNOSIS — E561 Deficiency of vitamin K: Secondary | ICD-10-CM

## 2018-03-24 DIAGNOSIS — I10 Essential (primary) hypertension: Secondary | ICD-10-CM

## 2018-03-24 DIAGNOSIS — H3562 Retinal hemorrhage, left eye: Secondary | ICD-10-CM | POA: Insufficient documentation

## 2018-03-24 DIAGNOSIS — E876 Hypokalemia: Secondary | ICD-10-CM | POA: Insufficient documentation

## 2018-03-24 DIAGNOSIS — R7309 Other abnormal glucose: Secondary | ICD-10-CM

## 2018-03-24 DIAGNOSIS — F840 Autistic disorder: Secondary | ICD-10-CM | POA: Insufficient documentation

## 2018-03-25 LAB — THYROID PANEL WITH TSH
FREE THYROXINE INDEX: 2.2 (ref 1.2–4.9)
T3 Uptake Ratio: 26 % (ref 24–39)
T4, Total: 8.3 ug/dL (ref 4.5–12.0)
TSH: 0.815 u[IU]/mL (ref 0.450–4.500)

## 2018-03-25 LAB — POTASSIUM: Potassium: 3.2 mmol/L — ABNORMAL LOW (ref 3.5–5.2)

## 2018-03-25 LAB — HEMOGLOBIN A1C
Est. average glucose Bld gHb Est-mCnc: 100 mg/dL
Hgb A1c MFr Bld: 5.1 % (ref 4.8–5.6)

## 2018-03-25 MED ORDER — POTASSIUM CHLORIDE CRYS ER 20 MEQ PO TBCR: 20.0000 meq | EXTENDED_RELEASE_TABLET | Freq: Every day | ORAL | 0 refills | Status: DC

## 2018-03-25 NOTE — Addendum Note (Signed)
Addended by: Bing Neighbors on: 03/25/2018 08:17 AM   Modules accepted: Orders

## 2018-03-28 ENCOUNTER — Ambulatory Visit (INDEPENDENT_AMBULATORY_CARE_PROVIDER_SITE_OTHER): Payer: Self-pay | Admitting: Family Medicine

## 2018-03-28 ENCOUNTER — Encounter: Payer: Self-pay | Admitting: Family Medicine

## 2018-03-28 VITALS — BP 150/102 | HR 80 | Temp 98.1°F | Resp 17 | Ht 73.0 in | Wt 202.0 lb

## 2018-03-28 DIAGNOSIS — E876 Hypokalemia: Secondary | ICD-10-CM

## 2018-03-28 DIAGNOSIS — I10 Essential (primary) hypertension: Secondary | ICD-10-CM

## 2018-03-28 MED ORDER — POTASSIUM CHLORIDE CRYS ER 20 MEQ PO TBCR: 20.0000 meq | EXTENDED_RELEASE_TABLET | Freq: Every day | ORAL | 0 refills | Status: DC

## 2018-03-28 MED ORDER — HYDROCHLOROTHIAZIDE 25 MG PO TABS: 25.0000 mg | ORAL_TABLET | Freq: Every day | ORAL | 3 refills | Status: DC

## 2018-03-28 NOTE — Progress Notes (Signed)
Patient ID: Jimmy Ibarra, male    DOB: Sep 27, 1985, 32 y.o.   MRN: [id number]  PCP: Bing Neighbors, FNP  Chief Complaint  Patient presents with  . Hypertension    denies chest pain, SHOB, lower extermity swelling, headaches    Subjective:  HPI Jimmy Ibarra is a 32 y.o. male presents for follow-up of hypertension. Britney was started on Amlodipine 5 mg for management of accelerated hypertension during an encounter at the ER 03/21/2018 in which he was found to have a BP 191/140 with an acute retinal hemorrhage. He established care here at [facility] on 03/23/2018 and again was found to be severely hypertensive and amlodipine was increased to 10 mg once daily. Today he presents for follow-up of blood pressure, he remains hypertensive although improved 150/102. Recent labs indicated he remains hypokalemic. He has not started his potassium he did not receive the message on Friday. He is experiencing cramping in his left toes which started today. Social History   Socioeconomic History  . Marital status: Married    Spouse name: Not on file  . Number of children: Not on file  . Years of education: Not on file  . Highest education level: Not on file  Occupational History  . Not on file  Social Needs  . Financial resource strain: Not on file  . Food insecurity:    Worry: Not on file    Inability: Not on file  . Transportation needs:    Medical: Not on file    Non-medical: Not on file  Tobacco Use  . Smoking status: Never Smoker  . Smokeless tobacco: Never Used  Substance and Sexual Activity  . Alcohol use: Yes    Comment: socially  . Drug use: No  . Sexual activity: Not on file  Lifestyle  . Physical activity:    Days per week: Not on file    Minutes per session: Not on file  . Stress: Not on file  Relationships  . Social connections:    Talks on phone: Not on file    Gets together: Not on file    Attends religious service: Not on file    Active member of club or organization:  Not on file    Attends meetings of clubs or organizations: Not on file    Relationship status: Not on file  . Intimate partner violence:    Fear of current or ex partner: Not on file    Emotionally abused: Not on file    Physically abused: Not on file    Forced sexual activity: Not on file  Other Topics Concern  . Not on file  Social History Narrative  . Not on file    Family History  Problem Relation Age of Onset  . Hypertension Neg Hx   . Cancer Neg Hx   . Heart disease Neg Hx   . Stroke Neg Hx    Review of Systems Pertinent negatives listed in HPI Patient Active Problem List   Diagnosis Date Noted  . Low serum vitamin K 03/24/2018  . Accelerated hypertension 03/24/2018  . Retinal hemorrhage , acute  03/24/2018  . Autism spectrum disorder 03/24/2018    No Known Allergies  Prior to Admission medications   Medication Sig Start Date End Date Taking? Authorizing Provider  amLODipine (NORVASC) 10 MG tablet Take 1 tablet (10 mg total) by mouth daily. 03/23/18  Yes Bing Neighbors, FNP  ibuprofen (ADVIL,MOTRIN) 200 MG tablet Take 400 mg by mouth every 6 (  six) hours as needed for headache.    Yes [provider]  Omega-3 Fatty Acids (FISH OIL ADULT GUMMIES PO) Take 2 tablets by mouth daily.   Yes [provider]  potassium chloride SA (K-DUR,KLOR-CON) 20 MEQ tablet Take 1 tablet (20 mEq total) by mouth daily. 03/25/18  Yes Bing Neighbors, FNP    Past Medical, Surgical Family and Social History reviewed and updated.    Objective:   Today's Vitals   03/28/18 1418  BP: (!) 150/102  Pulse: 80  Resp: 17  Temp: 98.1 F (36.7 C)  TempSrc: Oral  SpO2: 97%  Weight: 202 lb (91.6 kg)  Height: 6\' 1"  (1.854 m)    Wt Readings from Last 3 Encounters:  03/28/18 202 lb (91.6 kg)  03/23/18 203 lb 12.8 oz (92.4 kg)  01/19/17 200 lb (90.7 kg)     Physical Exam General appearance: alert, well developed, well nourished, cooperative and in no  distress Head: Normocephalic, without obvious abnormality, atraumatic Respiratory: Respirations even and unlabored, normal respiratory rate Heart: rate and rhythm normal. No gallop or murmurs noted on exam  Extremities: No gross deformities Skin: Skin color, texture, turgor normal. No rashes seen  Psych: Appropriate mood and affect. Neurologic: Mental status: Alert, oriented to person, place, and time, thought content appropriate. No results found for: POCGLU  Lab Results  Component Value Date   HGBA1C 5.1 03/24/2018      Assessment & Plan:  1. Accelerated hypertension, improving not at goal. Adding HCTZ 25 mg once daily and continue amlodipine 10 mg. We have discussed target BP range and blood pressure goal. I have advised patient to check BP regularly and to call us back or report to clinic if the numbers are consistently higher than 140/90. He has purchased a BP cuff for home use. We discussed the importance of compliance with medical therapy and DASH diet recommended, consequences of uncontrolled hypertension discussed.   2. Hypokalemia, 3.2  -Start potassium replacement 20 MEq x 5 days -Repeat K+ level in 2 weeks -Encouraged to increase dietary supplementation of potassium   Meds ordered this encounter  Medications  . DISCONTD: hydrochlorothiazide (HYDRODIURIL) 25 MG tablet    Sig: Take 1 tablet (25 mg total) by mouth daily.    Dispense:  90 tablet    Refill:  3  . potassium chloride SA (K-DUR,KLOR-CON) 20 MEQ tablet    Sig: Take 1 tablet (20 mEq total) by mouth daily.    Dispense:  5 tablet    Refill:  0    Sent to incorrect pharmacy [facility]  . hydrochlorothiazide (HYDRODIURIL) 25 MG tablet    Sig: Take 1 tablet (25 mg total) by mouth daily.    Dispense:  90 tablet    Refill:  3    Sent to incorrect pharmacy [facility]    -The patient was given clear instructions to go to ER or return to medical center if symptoms do not improve, worsen or new problems develop. The  patient verbalized understanding.    Joaquin Courts, FNP Primary Care at [facility] [address] [place] [address] [phone]: [phone]

## 2018-03-28 NOTE — Progress Notes (Signed)
Patient notified of results during visit with provider. Expressed understanding.

## 2018-03-28 NOTE — Patient Instructions (Addendum)
I am adding Hydrochlorothiazide 25 mg once daily to help better manage your blood pressure. Continue Amlodipine 10 mg once daily.  Your potassium is low. Start potassium supplements 20 mEq once daily for 5 days. Return in 2 weeks for repeat potassium level check and blood pressure check.    Hypertension Hypertension is another name for high blood pressure. High blood pressure forces your heart to work harder to pump blood. This can cause problems over time. There are two numbers in a blood pressure reading. There is a top number (systolic) over a bottom number (diastolic). It is best to have a blood pressure below 120/80. Healthy choices can help lower your blood pressure. You may need medicine to help lower your blood pressure if:  Your blood pressure cannot be lowered with healthy choices.  Your blood pressure is higher than 130/80.  Follow these instructions at home: Eating and drinking  If directed, follow the DASH eating plan. This diet includes: ? Filling half of your plate at each meal with fruits and vegetables. ? Filling one quarter of your plate at each meal with whole grains. Whole grains include whole wheat pasta, brown rice, and whole grain bread. ? Eating or drinking low-fat dairy products, such as skim milk or low-fat yogurt. ? Filling one quarter of your plate at each meal with low-fat (lean) proteins. Low-fat proteins include fish, skinless chicken, eggs, beans, and tofu. ? Avoiding fatty meat, cured and processed meat, or chicken with skin. ? Avoiding premade or processed food.  Eat less than 1,500 mg of salt (sodium) a day.  Limit alcohol use to no more than 1 drink a day for nonpregnant women and 2 drinks a day for men. One drink equals 12 oz of beer, 5 oz of wine, or 1 oz of hard liquor. Lifestyle  Work with your doctor to stay at a healthy weight or to lose weight. Ask your doctor what the best weight is for you.  Get at least 30 minutes of exercise that causes  your heart to beat faster (aerobic exercise) most days of the week. This may include walking, swimming, or biking.  Get at least 30 minutes of exercise that strengthens your muscles (resistance exercise) at least 3 days a week. This may include lifting weights or pilates.  Do not use any products that contain nicotine or tobacco. This includes cigarettes and e-cigarettes. If you need help quitting, ask your doctor.  Check your blood pressure at home as told by your doctor.  Keep all follow-up visits as told by your doctor. This is important. Medicines  Take over-the-counter and prescription medicines only as told by your doctor. Follow directions carefully.  Do not skip doses of blood pressure medicine. The medicine does not work as well if you skip doses. Skipping doses also puts you at risk for problems.  Ask your doctor about side effects or reactions to medicines that you should watch for. Contact a doctor if:  You think you are having a reaction to the medicine you are taking.  You have headaches that keep coming back (recurring).  You feel dizzy.  You have swelling in your ankles.  You have trouble with your vision. Get help right away if:  You get a very bad headache.  You start to feel confused.  You feel weak or numb.  You feel faint.  You get very bad pain in your: ? Chest. ? Belly (abdomen).  You throw up (vomit) more than once.  You have  trouble breathing. Summary  Hypertension is another name for high blood pressure.  Making healthy choices can help lower blood pressure. If your blood pressure cannot be controlled with healthy choices, you may need to take medicine. This information is not intended to replace advice given to you by your health care provider. Make sure you discuss any questions you have with your health care provider. Document Released: 11/04/2007 Document Revised: 04/15/2016 Document Reviewed: 04/15/2016 Elsevier Interactive Patient  Education  2018 [employer].    Hypokalemia Hypokalemia means that the amount of potassium in the blood is lower than normal.Potassium is a chemical that helps regulate the amount of fluid in the body (electrolyte). It also stimulates muscle tightening (contraction) and helps nerves work properly.Normally, most of the body's potassium is inside of cells, and only a very small amount is in the blood. Because the amount in the blood is so small, minor changes to potassium levels in the blood can be life-threatening. What are the causes? This condition may be caused by:  Antibiotic medicine.  Diarrhea or vomiting. Taking too much of a medicine that helps you have a bowel movement (laxative) can cause diarrhea and lead to hypokalemia.  Chronic kidney disease (CKD).  Medicines that help the body get rid of excess fluid (diuretics).  Eating disorders, such as bulimia.  Low magnesium levels in the body.  Sweating a lot.  What are the signs or symptoms? Symptoms of this condition include:  Weakness.  Constipation.  Fatigue.  Muscle cramps.  Mental confusion.  Skipped heartbeats or irregular heartbeat (palpitations).  Tingling or numbness.  How is this diagnosed? This condition is diagnosed with a blood test. How is this treated? Hypokalemia can be treated by taking potassium supplements by mouth or adjusting the medicines that you take. Treatment may also include eating more foods that contain a lot of potassium. If your potassium level is very low, you may need to get potassium through an IV tube in one of your veins and be monitored in the hospital. Follow these instructions at home:  Take over-the-counter and prescription medicines only as told by your health care provider. This includes vitamins and supplements.  Eat a healthy diet. A healthy diet includes fresh fruits and vegetables, whole grains, healthy fats, and lean proteins.  If instructed, eat more foods that  contain a lot of potassium, such as: ? Nuts, such as peanuts and pistachios. ? Seeds, such as sunflower seeds and pumpkin seeds. ? Peas, lentils, and lima beans. ? Whole grain and bran cereals and breads. ? Fresh fruits and vegetables, such as apricots, avocado, bananas, cantaloupe, kiwi, oranges, tomatoes, asparagus, and potatoes. ? Orange juice. ? Tomato juice. ? Red meats. ? Yogurt.  Keep all follow-up visits as told by your health care provider. This is important. Contact a health care provider if:  You have weakness that gets worse.  You feel your heart pounding or racing.  You vomit.  You have diarrhea.  You have diabetes (diabetes mellitus) and you have trouble keeping your blood sugar (glucose) in your target range. Get help right away if:  You have chest pain.  You have shortness of breath.  You have vomiting or diarrhea that lasts for more than 2 days.  You faint. This information is not intended to replace advice given to you by your health care provider. Make sure you discuss any questions you have with your health care provider. Document Released: 05/18/2005 Document Revised: 01/04/2016 Document Reviewed: 01/04/2016 Elsevier Interactive Patient  Education  2018 [employer]  

## 2018-04-12 ENCOUNTER — Ambulatory Visit (INDEPENDENT_AMBULATORY_CARE_PROVIDER_SITE_OTHER): Payer: Self-pay | Admitting: Family Medicine

## 2018-04-12 VITALS — BP 150/100 | HR 73

## 2018-04-12 DIAGNOSIS — E876 Hypokalemia: Secondary | ICD-10-CM

## 2018-04-12 DIAGNOSIS — I10 Essential (primary) hypertension: Secondary | ICD-10-CM

## 2018-04-12 MED ORDER — HYDROCHLOROTHIAZIDE 25 MG PO TABS: 50.0000 mg | ORAL_TABLET | Freq: Every day | ORAL | 3 refills | Status: DC

## 2018-04-12 NOTE — Progress Notes (Signed)
Patient here for BP check. Reading today was 159/103. States that home readings have been in the 140s-160s-90s. Denies chest pain, SHOB, lower extremity swelling, headaches. States that cramping has resolved. KWalker, CMA.

## 2018-04-12 NOTE — Progress Notes (Signed)
Michaela CornerHugh Andreoli, is a 32 y.o. male  NWG:[id number]  [id number]:[id number]  DOB - 11/13/1985  CC:  Chief Complaint  Patient presents with  . Blood Pressure Check       HPI: Jimmy RangeHugh Alex Boeke has Low serum vitamin K; Accelerated hypertension; Retinal hemorrhage , acute ; and Autism spectrum disorder on their problem list.    Today's visit:  Jimmy Ibarra initially presented for BP check today and was found to have accelerated hypertension. Reports consistent monitoring of blood pressure at home with systolic readings ranging 140-160, uncertain of diastolic readings.  Reports adherence to blood pressure medications. Reports efforts to adhere to low sodium diet. He is a nonsmoker and is making efforts to loose weight.. Denies any episodes of dizziness, headaches, blurring of vision, shortness of breath, or chest pain.   Current medications: Current Outpatient Medications:  .  amLODipine (NORVASC) 10 MG tablet, Take 1 tablet (10 mg total) by mouth daily., Disp: 90 tablet, Rfl: 3 .  hydrochlorothiazide (HYDRODIURIL) 25 MG tablet, Take 1 tablet (25 mg total) by mouth daily., Disp: 90 tablet, Rfl: 3 .  ibuprofen (ADVIL,MOTRIN) 200 MG tablet, Take 400 mg by mouth every 6 (six) hours as needed for headache. , Disp: , Rfl:  .  Omega-3 Fatty Acids (FISH OIL ADULT GUMMIES PO), Take 2 tablets by mouth daily., Disp: , Rfl:  .  potassium chloride SA (K-DUR,KLOR-CON) 20 MEQ tablet, Take 1 tablet (20 mEq total) by mouth daily., Disp: 5 tablet, Rfl: 0   Pertinent family medical history: family history is not on file.   No Known Allergies  Social History   Socioeconomic History  . Marital status: Married    Spouse name: Not on file  . Number of children: Not on file  . Years of education: Not on file  . Highest education level: Not on file  Occupational History  . Not on file  Social Needs  . Financial resource strain: Not on file  . Food insecurity:    Worry: Not on file    Inability: Not on file   . Transportation needs:    Medical: Not on file    Non-medical: Not on file  Tobacco Use  . Smoking status: Never Smoker  . Smokeless tobacco: Never Used  Substance and Sexual Activity  . Alcohol use: Yes    Comment: socially  . Drug use: No  . Sexual activity: Not on file  Lifestyle  . Physical activity:    Days per week: Not on file    Minutes per session: Not on file  . Stress: Not on file  Relationships  . Social connections:    Talks on phone: Not on file    Gets together: Not on file    Attends religious service: Not on file    Active member of club or organization: Not on file    Attends meetings of clubs or organizations: Not on file    Relationship status: Not on file  . Intimate partner violence:    Fear of current or ex partner: Not on file    Emotionally abused: Not on file    Physically abused: Not on file    Forced sexual activity: Not on file  Other Topics Concern  . Not on file  Social History Narrative  . Not on file   ROS Pertinent negatives listed in HPI  Objective:   Vitals:   04/12/18 1421 04/13/18 0811  BP: (!) 159/103 (!) 150/100  Pulse: 73  BP Readings from Last 3 Encounters:  03/28/18 (!) 150/102  03/23/18 (!) 170/120  03/21/18 (!) 191/140   General appearance: alert, well developed, well nourished, cooperative and in no distress Head: Normocephalic, without obvious abnormality, atraumatic Respiratory: Respirations even and unlabored, normal respiratory rate Extremities: No gross deformities Skin: Skin color, texture, turgor normal. No rashes seen  Psych: Appropriate mood and affect. Neurologic: Mental status: Alert, oriented to person, place, and time, thought content appropriate.   Lab Results (prior encounters)  Lab Results  Component Value Date   WBC 8.7 03/21/2018   HGB 16.2 03/21/2018   HCT 47.1 03/21/2018   MCV 84.7 03/21/2018   PLT 268 03/21/2018   Lab Results  Component Value Date   CREATININE 1.05 03/21/2018    BUN 12 03/21/2018   NA 139 03/21/2018   K 3.2 (L) 03/24/2018   CL 100 03/21/2018   CO2 30 03/21/2018    Lab Results  Component Value Date   HGBA1C 5.1 03/24/2018    No results found for: CHOL, TRIG, HDL, CHOLHDL, VLDL, LDLCALC      Assessment and plan:  1. Accelerated hypertension  - Ambulatory referral to Cardiology  2. Low blood potassium - Potassium Last K+ 3.2. Repeat Potassium today  F/U-1 month Hypertension follow-up  The patient was given clear instructions to go to ER or return to medical center if symptoms don't improve, worsen or new problems develop. The patient verbalized understanding. The patient was advised  to call and obtain lab results if they haven't heard anything from out office within 7-10 business days.   A total of 25 minutes spent, greater than 50 % of this time was spent counseling and coordination of care.   Joaquin Courts, FNP Primary Care at [facility] [address], [place] [address] [phone]: [phone]    This note has been created with Dragon speech recognition software and Paediatric nurse. Any transcriptional errors are unintentional.

## 2018-04-12 NOTE — Patient Instructions (Signed)
Complete financial assistance documentation. Once completed, contact our office and schedule an appointment with Mikle Bosworth our financial counselor.  We have increased your Hydrochlorothiazide to 50 mg once daily.   We will follow-up with you regarding your lab.   Cardiology will contact you to schedule an appointment.

## 2018-04-13 LAB — POTASSIUM: Potassium: 2.8 mmol/L — ABNORMAL LOW (ref 3.5–5.2)

## 2018-04-13 MED ORDER — POTASSIUM CHLORIDE CRYS ER 20 MEQ PO TBCR: 20.0000 meq | EXTENDED_RELEASE_TABLET | Freq: Every day | ORAL | 1 refills | Status: DC

## 2018-04-15 NOTE — Progress Notes (Signed)
Patient notified of results & recommendations. Expressed understanding.

## 2018-05-10 ENCOUNTER — Other Ambulatory Visit: Payer: Self-pay | Admitting: Family Medicine

## 2018-05-18 NOTE — Progress Notes (Deleted)
Cardiology Office Note   Date:  05/18/2018   ID:  Clearance, Chenault [birth year], MRN [id number]  PCP:  Bing Neighbors, FNP  Cardiologist:   Charlton Haws, MD   No chief complaint on file.     History of Present Illness: Jimmy Ibarra is a 32 y.o. male who presents for consultation regarding accelerated HTN.  Referred by Jerrilyn Cairo FNP Long standing HTN at least 4 years  Reviewed primary office note from 04/12/18 BP 159/103  Seen by optho 03/21/18 and referred to ER  For signs of retinal hemorrhage. No headache or visual changes  Rx with a dose of amlodipine with script  History includes ADHD and Autism spectrum disorder   Labs indicate low K 2.8-3.2 but he is on diuretic and has been given KLOR supplements  A1c normal 5.1 Cr 1.05  No excess ETOH does use NSAI's for pain. Tries to watch salt in diet Sedentary   ***  Past Medical History:  Diagnosis Date  . ADHD   . Autism spectrum disorder   . Depressive disorder   . History of Shoulder dislocation     Past Surgical History:  Procedure Laterality Date  . FRACTURE SURGERY       Current Outpatient Medications  Medication Sig Dispense Refill  . amLODipine (NORVASC) 10 MG tablet Take 1 tablet (10 mg total) by mouth daily. 90 tablet 3  . hydrochlorothiazide (HYDRODIURIL) 25 MG tablet Take 2 tablets (50 mg total) by mouth daily. 90 tablet 3  . ibuprofen (ADVIL,MOTRIN) 200 MG tablet Take 400 mg by mouth every 6 (six) hours as needed for headache.     Marland Kitchen KLOR-CON M20 20 MEQ tablet TAKE 1 TABLET BY MOUTH EVERY DAY 30 tablet 1  . Omega-3 Fatty Acids (FISH OIL ADULT GUMMIES PO) Take 2 tablets by mouth daily.     No current facility-administered medications for this visit.     Allergies:   Patient has no known allergies.    Social History:  The patient  reports that he has never smoked. He has never used smokeless tobacco. He reports current alcohol use. He reports that he does not use drugs.   Family History:   The patient's family history is not on file.    ROS:  Please see the history of present illness.   Otherwise, review of systems are positive for none.   All other systems are reviewed and negative.    PHYSICAL EXAM: VS:  There were no vitals taken for this visit. , BMI There is no height or weight on file to calculate BMI. Affect appropriate Healthy:  appears stated age HEENT: normal Neck supple with no adenopathy JVP normal no bruits no thyromegaly Lungs clear with no wheezing and good diaphragmatic motion Heart:  S1/S2 no murmur, no rub, gallop or click PMI normal Abdomen: benighn, BS positve, no tenderness, no AAA no bruit.  No HSM or HJR Distal pulses intact with no bruits No edema Neuro non-focal Skin warm and dry No muscular weakness    EKG:  ***   Recent Labs: 03/21/2018: BUN 12; Creatinine, Ser 1.05; Hemoglobin 16.2; Platelets 268; Sodium 139 03/24/2018: TSH 0.815 04/12/2018: Potassium 2.8    Lipid Panel No results found for: CHOL, TRIG, HDL, CHOLHDL, VLDL, LDLCALC, LDLDIRECT    Wt Readings from Last 3 Encounters:  03/28/18 202 lb (91.6 kg)  03/23/18 203 lb 12.8 oz (92.4 kg)  01/19/17 200 lb (90.7 kg)      Other  studies Reviewed: Additional studies/ records that were reviewed today include: Notes from ER/Labs Octomber 2019 notes Primary labs November 2019.    ASSESSMENT AND PLAN:  1.  HTN:  *** 2.  ADHD  *** 3. Autistic Spectrum    Current medicines are reviewed at length with the patient today.  The patient does not have concerns regarding medicines.  The following changes have been made:  ***  Labs/ tests ordered today include: Renal duplex / *** No orders of the defined types were placed in this encounter.    Disposition:   FU with cardiology PRN      Signed, Charlton HawsPeter Jareb Radoncic, MD  05/18/2018 5:05 PM    [facility] [address], [place], [place]  [address] Phone: [phone]; Fax: [phone]

## 2018-05-19 ENCOUNTER — Ambulatory Visit (INDEPENDENT_AMBULATORY_CARE_PROVIDER_SITE_OTHER): Payer: Self-pay | Admitting: Family Medicine

## 2018-05-19 ENCOUNTER — Encounter: Payer: Self-pay | Admitting: Family Medicine

## 2018-05-19 VITALS — BP 127/89 | HR 69 | Resp 17 | Ht 73.0 in | Wt 205.6 lb

## 2018-05-19 DIAGNOSIS — I10 Essential (primary) hypertension: Secondary | ICD-10-CM

## 2018-05-19 DIAGNOSIS — E876 Hypokalemia: Secondary | ICD-10-CM

## 2018-05-19 NOTE — Progress Notes (Signed)
Established Patient Office Visit  Subjective:  Patient ID: Jimmy Ibarra, male    DOB: 03-Mar-1986  Age: 32 y.o. MRN: [id number]  CC:  Chief Complaint  Patient presents with  . Hypertension    HPI Jimmy Ibarra presents for hypertension follow-up. Reports good home readings with most blood pressure readings <130 systolic and <90 diastolic. He denies any concerns with medications. He has had persistently low potassium levels and has been taking oral potassium replacement for this problems.  rReports efforts to adhere to low sodium diet. He is a nonsmoker. Denies any episodes of dizziness,headaches, shortness of breath, or chest pain. Recent follow-up with ophthalmic specialist and was told that macular edema has markedly improved. Patient had been referred to cardiology for a second opinion as to the cause of elevated blood pressure, however he declines to be evaluated at this time. He is negative for chest pain, shortness of breath, or edema. Past Medical History:  Diagnosis Date  . ADHD   . Autism spectrum disorder   . Depressive disorder   . History of Shoulder dislocation     Past Surgical History:  Procedure Laterality Date  . FRACTURE SURGERY      Family History  Problem Relation Age of Onset  . Hypertension Neg Hx   . Cancer Neg Hx   . Heart disease Neg Hx   . Stroke Neg Hx     Social History   Socioeconomic History  . Marital status: Married    Spouse name: Not on file  . Number of children: Not on file  . Years of education: Not on file  . Highest education level: Not on file  Occupational History  . Not on file  Social Needs  . Financial resource strain: Not on file  . Food insecurity:    Worry: Not on file    Inability: Not on file  . Transportation needs:    Medical: Not on file    Non-medical: Not on file  Tobacco Use  . Smoking status: Never Smoker  . Smokeless tobacco: Never Used  Substance and Sexual Activity  . Alcohol use: Yes   Comment: socially  . Drug use: No  . Sexual activity: Not on file  Lifestyle  . Physical activity:    Days per week: Not on file    Minutes per session: Not on file  . Stress: Not on file  Relationships  . Social connections:    Talks on phone: Not on file    Gets together: Not on file    Attends religious service: Not on file    Active member of club or organization: Not on file    Attends meetings of clubs or organizations: Not on file    Relationship status: Not on file  . Intimate partner violence:    Fear of current or ex partner: Not on file    Emotionally abused: Not on file    Physically abused: Not on file    Forced sexual activity: Not on file  Other Topics Concern  . Not on file  Social History Narrative  . Not on file    Outpatient Medications Prior to Visit  Medication Sig Dispense Refill  . amLODipine (NORVASC) 10 MG tablet Take 1 tablet (10 mg total) by mouth daily. 90 tablet 3  . hydrochlorothiazide (HYDRODIURIL) 25 MG tablet Take 2 tablets (50 mg total) by mouth daily. 90 tablet 3  . ibuprofen (ADVIL,MOTRIN) 200 MG tablet Take 400 mg by mouth  every 6 (six) hours as needed for headache.     Marland Kitchen. KLOR-CON M20 20 MEQ tablet TAKE 1 TABLET BY MOUTH EVERY DAY 30 tablet 1  . Omega-3 Fatty Acids (FISH OIL ADULT GUMMIES PO) Take 2 tablets by mouth daily.     No facility-administered medications prior to visit.     No Known Allergies  ROS Review of Systems Pertinent negatives listed in HPI   Objective:    Physical Exam  BP 127/89   Pulse 69   Resp 17   Ht 6\' 1"  (1.854 m)   Wt 205 lb 9.6 oz (93.3 kg)   SpO2 97%   BMI 27.13 kg/m  Wt Readings from Last 3 Encounters:  05/19/18 205 lb 9.6 oz (93.3 kg)  03/28/18 202 lb (91.6 kg)  03/23/18 203 lb 12.8 oz (92.4 kg)     Health Maintenance Due  Topic Date Due  . HIV Screening  03/14/2001  . TETANUS/TDAP  03/14/2005  . INFLUENZA VACCINE  12/30/2017    There are no preventive care reminders to display for  this patient.  Lab Results  Component Value Date   TSH 0.815 03/24/2018   Lab Results  Component Value Date   WBC 8.7 03/21/2018   HGB 16.2 03/21/2018   HCT 47.1 03/21/2018   MCV 84.7 03/21/2018   PLT 268 03/21/2018   Lab Results  Component Value Date   NA 139 03/21/2018   K 2.8 (L) 04/12/2018   CO2 30 03/21/2018   GLUCOSE 100 (H) 03/21/2018   BUN 12 03/21/2018   CREATININE 1.05 03/21/2018   CALCIUM 9.1 03/21/2018   ANIONGAP 9 03/21/2018   No results found for: CHOL No results found for: HDL No results found for: LDLCALC No results found for: TRIG No results found for: CHOLHDL Lab Results  Component Value Date   HGBA1C 5.1 03/24/2018      Assessment & Plan:   Problem List Items Addressed This Visit      Cardiovascular and Mediastinum   Essential Hypertension - Primary Well controlled today. No changes in therapy.    Other Visit Diagnoses    Low blood potassium       Hypokalemia, rechecking today. Continue daily potassium supplement.   Relevant Orders   Potassium (Completed)      Follow-up: Return in 6 months for hypertension management    Joaquin CourtsKimberly Jerimah Witucki, FNP [facility] at [facility] [address], [place] [id number] [phone]: [phone]

## 2018-05-20 ENCOUNTER — Other Ambulatory Visit (HOSPITAL_COMMUNITY): Payer: Self-pay | Admitting: Family Medicine

## 2018-05-20 ENCOUNTER — Other Ambulatory Visit: Payer: Self-pay

## 2018-05-20 ENCOUNTER — Ambulatory Visit: Payer: Self-pay | Admitting: Cardiovascular Disease

## 2018-05-20 LAB — POTASSIUM: POTASSIUM: 3 mmol/L — AB (ref 3.5–5.2)

## 2018-05-20 MED ORDER — POTASSIUM CHLORIDE CRYS ER 20 MEQ PO TBCR: 20.0000 meq | EXTENDED_RELEASE_TABLET | Freq: Two times a day (BID) | ORAL | 3 refills | Status: DC

## 2018-05-20 MED ORDER — POTASSIUM CHLORIDE CRYS ER 20 MEQ PO TBCR: 20.0000 meq | EXTENDED_RELEASE_TABLET | Freq: Every day | ORAL | 3 refills | Status: DC

## 2018-05-20 NOTE — Progress Notes (Signed)
Patient notified of results & recommendations. Expressed understanding. Made a lab appt for 06/30/18 @ 2:15 pm.

## 2018-06-30 ENCOUNTER — Other Ambulatory Visit: Payer: Self-pay

## 2018-06-30 DIAGNOSIS — E876 Hypokalemia: Secondary | ICD-10-CM

## 2018-07-01 LAB — POTASSIUM: Potassium: 3.3 mmol/L — ABNORMAL LOW (ref 3.5–5.2)

## 2018-07-05 NOTE — Progress Notes (Signed)
Patient notified of results & recommendations. Expressed understanding.

## 2018-11-16 ENCOUNTER — Telehealth: Payer: Self-pay

## 2018-11-16 NOTE — Telephone Encounter (Signed)
Called patient to do their pre-visit COVID screening.  Call went to voicemail. Unable to do prescreening.  

## 2018-11-17 ENCOUNTER — Ambulatory Visit (INDEPENDENT_AMBULATORY_CARE_PROVIDER_SITE_OTHER): Payer: Self-pay | Admitting: Family Medicine

## 2018-11-17 ENCOUNTER — Encounter: Payer: Self-pay | Admitting: Family Medicine

## 2018-11-17 ENCOUNTER — Other Ambulatory Visit: Payer: Self-pay

## 2018-11-17 VITALS — BP 143/93 | HR 64 | Temp 98.1°F | Resp 17 | Ht 73.0 in | Wt 213.6 lb

## 2018-11-17 DIAGNOSIS — E876 Hypokalemia: Secondary | ICD-10-CM

## 2018-11-17 DIAGNOSIS — N529 Male erectile dysfunction, unspecified: Secondary | ICD-10-CM

## 2018-11-17 DIAGNOSIS — I10 Essential (primary) hypertension: Secondary | ICD-10-CM

## 2018-11-17 MED ORDER — AMLODIPINE BESYLATE 10 MG PO TABS: 10.0000 mg | ORAL_TABLET | Freq: Every day | ORAL | 3 refills | Status: DC

## 2018-11-17 MED ORDER — SPIRONOLACTONE 50 MG PO TABS: 50.0000 mg | ORAL_TABLET | Freq: Every day | ORAL | 2 refills | Status: DC

## 2018-11-17 NOTE — Patient Instructions (Addendum)
Stop taking hydrochlorothiazide 25 mg tablets.   I am starting you on spironolactone 50 mg once daily.  You will continue your amlodipine 10 mg once daily.   You will stop potassium tablets because you are now taking spironolactone which will help improve your potassium level. You will return in 4 weeks to have a repeat potassium level drawn and blood pressure check.      Hypertension Hypertension is another name for high blood pressure. High blood pressure forces your heart to work harder to pump blood. This can cause problems over time. There are two numbers in a blood pressure reading. There is a top number (systolic) over a bottom number (diastolic). It is best to have a blood pressure below 120/80. Healthy choices can help lower your blood pressure. You may need medicine to help lower your blood pressure if:  Your blood pressure cannot be lowered with healthy choices.  Your blood pressure is higher than 130/80. Follow these instructions at home: Eating and drinking   If directed, follow the DASH eating plan. This diet includes: ? Filling half of your plate at each meal with fruits and vegetables. ? Filling one quarter of your plate at each meal with whole grains. Whole grains include whole wheat pasta, brown rice, and whole grain bread. ? Eating or drinking low-fat dairy products, such as skim milk or low-fat yogurt. ? Filling one quarter of your plate at each meal with low-fat (lean) proteins. Low-fat proteins include fish, skinless chicken, eggs, beans, and tofu. ? Avoiding fatty meat, cured and processed meat, or chicken with skin. ? Avoiding premade or processed food.  Eat less than 1,500 mg of salt (sodium) a day.  Limit alcohol use to no more than 1 drink a day for nonpregnant women and 2 drinks a day for men. One drink equals 12 oz of beer, 5 oz of wine, or 1 oz of hard liquor. Lifestyle  Work with your doctor to stay at a healthy weight or to lose weight. Ask your  doctor what the best weight is for you.  Get at least 30 minutes of exercise that causes your heart to beat faster (aerobic exercise) most days of the week. This may include walking, swimming, or biking.  Get at least 30 minutes of exercise that strengthens your muscles (resistance exercise) at least 3 days a week. This may include lifting weights or pilates.  Do not use any products that contain nicotine or tobacco. This includes cigarettes and e-cigarettes. If you need help quitting, ask your doctor.  Check your blood pressure at home as told by your doctor.  Keep all follow-up visits as told by your doctor. This is important. Medicines  Take over-the-counter and prescription medicines only as told by your doctor. Follow directions carefully.  Do not skip doses of blood pressure medicine. The medicine does not work as well if you skip doses. Skipping doses also puts you at risk for problems.  Ask your doctor about side effects or reactions to medicines that you should watch for. Contact a doctor if:  You think you are having a reaction to the medicine you are taking.  You have headaches that keep coming back (recurring).  You feel dizzy.  You have swelling in your ankles.  You have trouble with your vision. Get help right away if:  You get a very bad headache.  You start to feel confused.  You feel weak or numb.  You feel faint.  You get very bad pain  in your: ? Chest. ? Belly (abdomen).  You throw up (vomit) more than once.  You have trouble breathing. Summary  Hypertension is another name for high blood pressure.  Making healthy choices can help lower blood pressure. If your blood pressure cannot be controlled with healthy choices, you may need to take medicine. This information is not intended to replace advice given to you by your health care provider. Make sure you discuss any questions you have with your health care provider. Document Released: 11/04/2007  Document Revised: 04/15/2016 Document Reviewed: 04/15/2016 Elsevier Interactive Patient Education  2019 [employer].

## 2018-11-17 NOTE — Progress Notes (Signed)
Established Patient Office Visit  Subjective:  Patient ID: [name], male    DOB: [birth year]  Age: 33 y.o. MRN: [id number]  CC:  Chief Complaint  Patient presents with  . Hypertension    HPI Jimmy MalletHugh Alex ArgentinaIrish presents for hypertension follow-up. Reports home readings have slightly increased since he has been furlough from work over the last few months.  He reports home readings systolically between 140 and 150, diastolic readings between 90 and 100.  Due to cost hydrochlorothiazide was added to amlodipine back in November and patient was also placed on oral potassium replacement twice daily to circumvent chronic hypokalemia.  Last potassium level was 3.3 approximately 5 months ago. Reports efforts to adhere to low sodium diet. He is a nonsmoker. Denies any episodes of dizziness,headaches, shortness of breath, or chest pain.Patient had been referred to cardiology for a second opinion as to the cause of elevated blood pressure, however he continues to decline to be evaluated at this time. He is negative for chest pain, shortness of breath, or edema.  Erectile Dysfunction  Patient complains of erectile dysfunction.  Onset of dysfunction was a few months back after starting blood pressure medications. Complains of difficulty achieving and maintaining an erection. No prior treatment for ED symptoms. Past Medical History:  Diagnosis Date  . ADHD   . Autism spectrum disorder   . Depressive disorder   . History of Shoulder dislocation     Past Surgical History:  Procedure Laterality Date  . FRACTURE SURGERY      Family History  Problem Relation Age of Onset  . Hypertension Neg Hx   . Cancer Neg Hx   . Heart disease Neg Hx   . Stroke Neg Hx     Social History   Socioeconomic History  . Marital status: Married    Spouse name: Not on file  . Number of children: Not on file  . Years of education: Not on file  . Highest education level: Not on file  Occupational History  . Not  on file  Social Needs  . Financial resource strain: Not on file  . Food insecurity    Worry: Not on file    Inability: Not on file  . Transportation needs    Medical: Not on file    Non-medical: Not on file  Tobacco Use  . Smoking status: Never Smoker  . Smokeless tobacco: Never Used  Substance and Sexual Activity  . Alcohol use: Yes    Comment: socially  . Drug use: No  . Sexual activity: Not on file  Lifestyle  . Physical activity    Days per week: Not on file    Minutes per session: Not on file  . Stress: Not on file  Relationships  . Social Musicianconnections    Talks on phone: Not on file    Gets together: Not on file    Attends religious service: Not on file    Active member of club or organization: Not on file    Attends meetings of clubs or organizations: Not on file    Relationship status: Not on file  . Intimate partner violence    Fear of current or ex partner: Not on file    Emotionally abused: Not on file    Physically abused: Not on file    Forced sexual activity: Not on file  Other Topics Concern  . Not on file  Social History Narrative  . Not on file    Outpatient Medications  Prior to Visit  Medication Sig Dispense Refill  . amLODipine (NORVASC) 10 MG tablet Take 1 tablet (10 mg total) by mouth daily. 90 tablet 3  . hydrochlorothiazide (HYDRODIURIL) 25 MG tablet Take 2 tablets (50 mg total) by mouth daily. 90 tablet 3  . potassium chloride SA (KLOR-CON M20) 20 MEQ tablet Take 1 tablet (20 mEq total) by mouth 2 (two) times daily. 60 tablet 3  . ibuprofen (ADVIL,MOTRIN) 200 MG tablet Take 400 mg by mouth every 6 (six) hours as needed for headache.     . Omega-3 Fatty Acids (FISH OIL ADULT GUMMIES PO) Take 2 tablets by mouth daily.     No facility-administered medications prior to visit.     No Known Allergies  ROS Review of Systems Pertinent negatives listed in HPI   Objective:    Physical Exam  BP (!) 150/97   Pulse 70   Temp 98.1 F (36.7 C)    Resp 17   Ht 6\' 1"  (1.854 m)   Wt 213 lb 9.6 oz (96.9 kg)   SpO2 97%   BMI 28.18 kg/m  Wt Readings from Last 3 Encounters:  11/17/18 213 lb 9.6 oz (96.9 kg)  05/19/18 205 lb 9.6 oz (93.3 kg)  03/28/18 202 lb (91.6 kg)   General appearance: alert, well developed, well nourished, cooperative and in no distress Head: Normocephalic, without obvious abnormality, atraumatic Respiratory: Respirations even and unlabored, normal respiratory rate Heart: rate and rhythm normal. No gallop or murmurs noted on exam  Abdomen: BS +, no distention, no rebound tenderness, or no mass Extremities: No gross deformities Skin: Skin color, texture, turgor normal. No rashes seen  Psych: Appropriate mood and affect. Neurologic: Mental status: Alert, oriented to person, place, and time, thought content appropriate. Health Maintenance Due  Topic Date Due  . HIV Screening  03/14/2001  . TETANUS/TDAP  03/14/2005    There are no preventive care reminders to display for this patient.  Lab Results  Component Value Date   TSH 0.815 03/24/2018   Lab Results  Component Value Date   WBC 8.7 03/21/2018   HGB 16.2 03/21/2018   HCT 47.1 03/21/2018   MCV 84.7 03/21/2018   PLT 268 03/21/2018   Lab Results  Component Value Date   NA 139 03/21/2018   K 3.3 (L) 06/30/2018   CO2 30 03/21/2018   GLUCOSE 100 (H) 03/21/2018   BUN 12 03/21/2018   CREATININE 1.05 03/21/2018   CALCIUM 9.1 03/21/2018   ANIONGAP 9 03/21/2018   Lab Results  Component Value Date   HGBA1C 5.1 03/24/2018      Assessment & Plan:  1. Hypokalemia Rechecking potassium Discontinue hydrochlorothiazide as this is likely the source of worsening hyporkalemia.  2. Essential hypertension -Blood pressure is not at goal today.  Patient reports readings have been increasing at home as well.  Due to COVID-19 patient has recently been an active of activity and he is currently under furlough and remains home most of the time. Discontinue  hydrochlorothiazide start spironolactone 50 mg once daily.  Given this is a potassium sparing medication hopefully this will improve potassium level.  Patient will return in 1 month for repeat potassium level and a blood pressure check.  Will titrate spironolactone as needed to obtain a blood pressure of less than 140/90.  3. Erectile dysfunction, unspecified erectile dysfunction type -Suspect source of ED symptoms is likely hydrochlorothiazide.  Will discontinue patient follow-up if symptoms persist.  Follow-up: Return in 1 month BP check  and repeat potassium. Return in 3 months with provider.    Joaquin CourtsKimberly Constanza Mincy, FNP [facility] [address], [place] [id number] [phone]: [phone]

## 2018-11-18 LAB — POTASSIUM: Potassium: 3.1 mmol/L — ABNORMAL LOW (ref 3.5–5.2)

## 2018-11-21 NOTE — Progress Notes (Signed)
Patient notified of results & recommendations. Expressed understanding. Is aware to not take the potassium supplement with the Spironolactone.

## 2018-12-15 ENCOUNTER — Ambulatory Visit (INDEPENDENT_AMBULATORY_CARE_PROVIDER_SITE_OTHER): Payer: Self-pay

## 2018-12-15 ENCOUNTER — Other Ambulatory Visit: Payer: Self-pay

## 2018-12-15 VITALS — BP 129/88 | HR 66

## 2018-12-15 DIAGNOSIS — Z0130 Encounter for examination of blood pressure without abnormal findings: Secondary | ICD-10-CM

## 2018-12-15 DIAGNOSIS — E876 Hypokalemia: Secondary | ICD-10-CM

## 2018-12-15 DIAGNOSIS — I10 Essential (primary) hypertension: Secondary | ICD-10-CM

## 2018-12-15 MED ORDER — AMLODIPINE BESYLATE 10 MG PO TABS: 10.0000 mg | ORAL_TABLET | Freq: Every day | ORAL | 1 refills | Status: DC

## 2018-12-15 MED ORDER — SPIRONOLACTONE 50 MG PO TABS: 50.0000 mg | ORAL_TABLET | Freq: Every day | ORAL | 1 refills | Status: DC

## 2018-12-15 NOTE — Progress Notes (Signed)
Patient here for BP check & potassium recheck. BP was 129/88, pulse 66. Spoke with provider & she states that we will wait for potassium results but if potassium is ok patient will be good to follow up in 6 months. Patient expressed understanding. He did ask for a 90 day supply of the Spironolactone. Prescription sent to pharmacy. KWalker, CMA.

## 2018-12-16 LAB — POTASSIUM: Potassium: 4.1 mmol/L (ref 3.5–5.2)

## 2018-12-16 NOTE — Progress Notes (Signed)
Patient notified of results & recommendations. Expressed understanding. Made 6 month follow up appointment for 06/19/2019 @ 1:30 pm.

## 2019-03-23 ENCOUNTER — Other Ambulatory Visit: Payer: Self-pay

## 2019-03-23 DIAGNOSIS — Z20822 Contact with and (suspected) exposure to covid-19: Secondary | ICD-10-CM

## 2019-03-25 LAB — NOVEL CORONAVIRUS, NAA: SARS-CoV-2, NAA: NOT DETECTED

## 2019-06-19 ENCOUNTER — Ambulatory Visit: Payer: Self-pay

## 2019-06-20 ENCOUNTER — Other Ambulatory Visit: Payer: Self-pay

## 2019-06-20 ENCOUNTER — Ambulatory Visit (INDEPENDENT_AMBULATORY_CARE_PROVIDER_SITE_OTHER): Payer: Self-pay | Admitting: Internal Medicine

## 2019-06-20 VITALS — BP 168/68 | HR 73

## 2019-06-20 DIAGNOSIS — I10 Essential (primary) hypertension: Secondary | ICD-10-CM

## 2019-06-20 DIAGNOSIS — M545 Low back pain, unspecified: Secondary | ICD-10-CM

## 2019-06-20 MED ORDER — METHOCARBAMOL 500 MG PO TABS: 500.0000 mg | ORAL_TABLET | Freq: Three times a day (TID) | ORAL | 0 refills | Status: DC | PRN

## 2019-06-20 MED ORDER — LOSARTAN POTASSIUM 25 MG PO TABS: 25.0000 mg | ORAL_TABLET | Freq: Every day | ORAL | 6 refills | Status: DC

## 2019-06-20 NOTE — Progress Notes (Signed)
Virtual Visit via Telephone Note Due to current restrictions/limitations of in-office visits due to the COVID-19 pandemic, this scheduled clinical appointment was converted to a telehealth visit  I connected with Jimmy Ibarra on 06/20/19 at 9:45 a.m by telephone and verified that I am speaking with the correct person using two identifiers. I am in my office.  The patient is at home.  Only the patient and myself participated in this encounter.  I discussed the limitations, risks, security and privacy concerns of performing an evaluation and management service by telephone and the availability of in person appointments. I also discussed with the patient that there may be a patient responsible charge related to this service. The patient expressed understanding and agreed to proceed.   History of Present Illness: Pt with hx of HTN.  Patient was last seen by NP Tiburcio Pea 10/2018.  Purpose of today's visit is follow-up on blood pressure.  Pt c/o back pain for past few days but getting better.  This happens occasionally.  Located lower back in the center.  No radiation. No numbness or tingling.  No loss of bowel or bladder function.  No fever or dysuria.  No initiating factors. Not working currently.  Worse when getting up from laying down or sitting.  Taking generic Tylenol 500 mg 2 tabs three times a day.   HYPERTENSION Currently taking: see medication list Med Adherence: [x]  Yes - Norvasc and Spironolactone    []  No Medication side effects: []  Yes    [x]  No Adherence with salt restriction: [x]  Yes    []  No Home Monitoring?: [x]  Yes    []  No Monitoring Frequency: every few days Home BP results Ibarra:  150-170/100 in the last wk.  This a.m was 160/79.  Prior to this wk SBP in the 130-140s.  Not sure why BP has gone up.  He wonders whether the pain is causing it SOB? [x]  Yes  -sometimes when he takes in a deep breath Chest Pain?: []  Yes    [x]  No Leg swelling?: []  Yes    [x]  No Headaches?: []  Yes     [x]  No Dizziness? []  Yes    []  No Comments:      Observations/Objective: No direct observation done as this was a telephone encounter.  Assessment and Plan: 1. Essential hypertension Blood pressure not at goal.  Goal is 130/80 or lower. Advised patient that pain can elevate blood pressure and so can dietary indiscretions.  Sometimes there may be secondary causes for uncontrolled HTN but we will see how he does first with adding another blood pressure medication.  His blood pressure prior to his back pain was lower but still not at goal.  I recommend adding Cozaar.  Recommend that he comes to the lab for chemistry check after he has been on the medicine for about 1 week.  Continue to check blood pressures at home.  Follow-up visit for blood pressure recheck in about 4 to 6 weeks  2. Acute midline low back pain without sciatica May be muscle spasms or strain.  No red flags from the history given.  I recommend using a heating pad to the back as needed, continue acetaminophen as needed and add Robaxin.  Advised patient that Robaxin is a muscle relaxant that can cause some drowsiness.   Follow Up Instructions: 4-6 wks    I discussed the assessment and treatment plan with the patient. The patient was provided an opportunity to ask questions and all were answered. The patient  agreed with the plan and demonstrated an understanding of the instructions.   The patient was advised to call back or seek an in-person evaluation if the symptoms worsen or if the condition fails to improve as anticipated.  I provided 18 minutes of non-face-to-face time during this encounter.   Karle Plumber, MD

## 2019-06-20 NOTE — Progress Notes (Signed)
BP readings at home- 168/68  When he gets up from sitting he c/o lower back pain. Pain 5/10  Takes Acetaminophen

## 2019-07-10 ENCOUNTER — Telehealth: Payer: Self-pay | Admitting: Family Medicine

## 2019-07-10 MED ORDER — AMLODIPINE BESYLATE 10 MG PO TABS: 10.0000 mg | ORAL_TABLET | Freq: Every day | ORAL | 0 refills | Status: DC

## 2019-07-10 MED ORDER — SPIRONOLACTONE 50 MG PO TABS: 50.0000 mg | ORAL_TABLET | Freq: Every day | ORAL | 0 refills | Status: DC

## 2019-07-10 NOTE — Telephone Encounter (Signed)
1) Medication(s) Requested (by name):spironolactone (ALDACTONE) 50 MG tablet [[id number]]    2) Pharmacy of Choice:[id number] - [place], [place] - [address]   3) Special Requests:   Approved medications will be sent to the pharmacy, we will reach out if there is an issue.  Requests made after 3pm may not be addressed until the following business day!  If a patient is unsure of the name of the medication(s) please note and ask patient to call back when they are able to provide all info, do not send to responsible party until all information is available!

## 2019-07-10 NOTE — Telephone Encounter (Signed)
Refills sent

## 2019-07-21 ENCOUNTER — Telehealth: Payer: Self-pay

## 2019-07-21 NOTE — Telephone Encounter (Signed)
Called patient to do their pre-visit COVID screening.  Patient states that he has been tentatively scheduled for jury duty on 07/24/2019. He hasn't heard definitively if he will be summoned or not. Patient will give the office a call prior to our closing to let us know if he will have to reschedule appointment.

## 2019-07-24 ENCOUNTER — Ambulatory Visit (INDEPENDENT_AMBULATORY_CARE_PROVIDER_SITE_OTHER): Payer: Self-pay | Admitting: Internal Medicine

## 2019-07-24 ENCOUNTER — Encounter: Payer: Self-pay | Admitting: Internal Medicine

## 2019-07-24 VITALS — BP 121/82 | HR 71 | Temp 97.5°F | Resp 17 | Wt 220.4 lb

## 2019-07-24 DIAGNOSIS — I10 Essential (primary) hypertension: Secondary | ICD-10-CM

## 2019-07-24 MED ORDER — SPIRONOLACTONE 50 MG PO TABS: 50.0000 mg | ORAL_TABLET | Freq: Every day | ORAL | 3 refills | Status: DC

## 2019-07-24 MED ORDER — AMLODIPINE BESYLATE 10 MG PO TABS: 10.0000 mg | ORAL_TABLET | Freq: Every day | ORAL | 3 refills | Status: DC

## 2019-07-24 NOTE — Progress Notes (Signed)
Virtual Visit via Telephone Note  I connected with Jimmy Ibarra, on 07/24/2019 at 3:01 PM by telephone due to the COVID-19 pandemic and verified that I am speaking with the correct person using two identifiers.   Consent: I discussed the limitations, risks, security and privacy concerns of performing an evaluation and management service by telephone and the availability of in person appointments. I also discussed with the patient that there may be a patient responsible charge related to this service. The patient expressed understanding and agreed to proceed.   Location of Patient: Clinic (was supposed to be virtual visit but patient came to clinic)   Location of Provider: Home    Persons participating in Telemedicine visit: Shamel Germond [facility] Dr. Juleen China      History of Present Illness: Patient has a visit for BP follow up.   Chronic HTN Disease Monitoring:  Home BP Monitoring - Patient reports BP is higher at home than it is when it is checked in the office.  Chest pain- no  Dyspnea- no Headache - no  Medications: Norvasc 10 mg, Losartan 25 mg, Spironolactone 50 mg  Compliance- yes Lightheadedness- no  Edema- no   Preventitive Healthcare:  Exercise: yes, started going to the [facility] again and is doing the dog walking    Diet Pattern: Limited meals throughout the day and reduced snacks.   Salt Restriction: Yes, has made changes with Na intake   Reports concerns about addition of Losartan impacting his libido and ability to maintain an erection.     Past Medical History:  Diagnosis Date  . ADHD   . Autism spectrum disorder   . Depressive disorder   . History of Shoulder dislocation    No Known Allergies  Current Outpatient Medications on File Prior to Visit  Medication Sig Dispense Refill  . amLODipine (NORVASC) 10 MG tablet Take 1 tablet (10 mg total) by mouth daily. 90 tablet 0  . losartan (COZAAR) 25 MG tablet Take 1 tablet (25  mg total) by mouth daily. 30 tablet 6  . spironolactone (ALDACTONE) 50 MG tablet Take 1 tablet (50 mg total) by mouth daily. 90 tablet 0   No current facility-administered medications on file prior to visit.    Observations/Objective: Vitals:   07/24/19 1453  BP: 121/82  Pulse: 71  Resp: 17  Temp: (!) 97.5 F (36.4 C)   NAD. Speaking clearly.  Work of breathing normal.  Alert and oriented. Mood appropriate.   Assessment and Plan: 1. Essential hypertension BP well within goal today. Patient does report it runs higher at home. He would like to wean off Losartan due to concern regarding sexual side effects. Advised to start by cutting tablet in half. Will need to monitor and have him return in one month for HTN f/u. Patient to bring cuff for comparison to in office measurements. Discussed long term ways to reduce HTN such as weight loss (patient has goal to be <200 lbs) and continued adherence to diet/exercise changes.  - Basic Metabolic Panel - amLODipine (NORVASC) 10 MG tablet; Take 1 tablet (10 mg total) by mouth daily.  Dispense: 90 tablet; Refill: 3 - spironolactone (ALDACTONE) 50 MG tablet; Take 1 tablet (50 mg total) by mouth daily.  Dispense: 90 tablet; Refill: 3   Follow Up Instructions: Return in one month for BP follow up and bring cuff    I discussed the assessment and treatment plan with the patient. The patient was provided an opportunity to ask questions  and all were answered. The patient agreed with the plan and demonstrated an understanding of the instructions.   The patient was advised to call back or seek an in-person evaluation if the symptoms worsen or if the condition fails to improve as anticipated.     I provided 16 minutes total of non-face-to-face time during this encounter including median intraservice time, reviewing previous notes, investigations, ordering medications, medical decision making, coordinating care and patient verbalized understanding at the  end of the visit.    Marcy Siren, D.O. Primary Care at [facility]  07/24/2019, 3:01 PM

## 2019-07-25 LAB — BASIC METABOLIC PANEL
BUN/Creatinine Ratio: 14 (ref 9–20)
BUN: 16 mg/dL (ref 6–20)
CO2: 24 mmol/L (ref 20–29)
Calcium: 9.4 mg/dL (ref 8.7–10.2)
Chloride: 102 mmol/L (ref 96–106)
Creatinine, Ser: 1.16 mg/dL (ref 0.76–1.27)
GFR calc Af Amer: 95 mL/min/{1.73_m2} (ref >59)
GFR calc non Af Amer: 82 mL/min/{1.73_m2} (ref >59)
Glucose: 94 mg/dL (ref 65–99)
Potassium: 4.1 mmol/L (ref 3.5–5.2)
Sodium: 140 mmol/L (ref 134–144)

## 2019-07-27 NOTE — Progress Notes (Signed)
Patient notified of results & recommendations. Expressed understanding.

## 2019-08-17 ENCOUNTER — Telehealth: Payer: Self-pay

## 2019-08-17 NOTE — Telephone Encounter (Signed)

## 2019-08-21 ENCOUNTER — Encounter: Payer: Self-pay | Admitting: Internal Medicine

## 2019-08-21 ENCOUNTER — Other Ambulatory Visit: Payer: Self-pay

## 2019-08-21 ENCOUNTER — Ambulatory Visit (INDEPENDENT_AMBULATORY_CARE_PROVIDER_SITE_OTHER): Payer: Self-pay | Admitting: Internal Medicine

## 2019-08-21 VITALS — BP 124/80 | HR 63 | Temp 97.2°F | Resp 17 | Ht 73.0 in | Wt 216.0 lb

## 2019-08-21 DIAGNOSIS — I10 Essential (primary) hypertension: Secondary | ICD-10-CM

## 2019-08-21 NOTE — Patient Instructions (Signed)
Thank you for choosing Primary Care at [facility] to be your medical home!    Jimmy Ibarra was seen by De Hollingshead, DO today.   Jimmy Ibarra's primary care provider is Marcy Siren, DO.   For the best care possible, you should try to see Marcy Siren, DO whenever you come to the clinic.   We look forward to seeing you again soon!  If you have any questions about your visit today, please call us at [phone] or feel free to reach your primary care provider via MyChart.

## 2019-08-21 NOTE — Progress Notes (Signed)
  Subjective:    Jimmy Ibarra - 34 y.o. male MRN [id number]  Date of birth: 05-Feb-1986  HPI  Jimmy Ibarra is here for HTN f/u.  Chronic HTN Disease Monitoring:  Home BP Monitoring - Yes. Home BP cuff reads 145/99 at office visit today.  Chest pain- no  Dyspnea- no Headache - no  Medications: Amlodipine 10 mg, Spironolactone 50 mg, Losartan 12.5 mg (cut in half due to ED and symptoms resolved)   Compliance- yes Lightheadedness- no  Edema- no    Health Maintenance:  There are no preventive care reminders to display for this patient.  -  reports that he has never smoked. He has never used smokeless tobacco. - Review of Systems: Per HPI. - Past Medical History: Patient Active Problem List   Diagnosis Date Noted  . Low serum vitamin K 03/24/2018  . Essential hypertension 03/24/2018  . Retinal hemorrhage , acute  03/24/2018  . Autism spectrum disorder 03/24/2018   - Medications: reviewed and updated   Objective:   Physical Exam BP 124/80   Pulse 63   Temp (!) 97.2 F (36.2 C) (Temporal)   Resp 17   Ht 6\' 1"  (1.854 m)   Wt 216 lb (98 kg)   SpO2 96%   BMI 28.50 kg/m  Physical Exam  Constitutional: He is oriented to person, place, and time and well-developed, well-nourished, and in no distress. No distress.  Cardiovascular: Normal rate.  Pulmonary/Chest: Effort normal. No respiratory distress.  Musculoskeletal:        General: Normal range of motion.  Neurological: He is alert and oriented to person, place, and time.  Skin: Skin is warm and dry. He is not diaphoretic.  Psychiatric: Affect and judgment normal.      Assessment & Plan:   1. Essential hypertension BP at goal today. Home BP cuff reading much higher. Had patient place on arm and is in proper position and fits appropriately--with fit would not anticipate skewed numbers. Discussed reaching out to company or purchasing a new cuff as replacement. Discussed goal BP. Patient is also  currently working on weight loss and better diet. Return in 6 months to f/u BP or sooner if has concerns.      , D.O. 08/21/2019, 2:55 PM Primary Care at [facility]

## 2019-12-26 ENCOUNTER — Telehealth (INDEPENDENT_AMBULATORY_CARE_PROVIDER_SITE_OTHER): Payer: Self-pay | Admitting: Internal Medicine

## 2019-12-26 ENCOUNTER — Encounter: Payer: Self-pay | Admitting: Internal Medicine

## 2020-01-09 ENCOUNTER — Other Ambulatory Visit: Payer: Self-pay | Admitting: Internal Medicine

## 2020-01-09 DIAGNOSIS — I10 Essential (primary) hypertension: Secondary | ICD-10-CM

## 2020-01-27 ENCOUNTER — Encounter (HOSPITAL_COMMUNITY): Payer: Self-pay | Admitting: Emergency Medicine

## 2020-01-27 ENCOUNTER — Emergency Department (HOSPITAL_COMMUNITY): Payer: No Typology Code available for payment source

## 2020-01-27 ENCOUNTER — Emergency Department (HOSPITAL_COMMUNITY)
Admission: EM | Admit: 2020-01-27 | Discharge: 2020-01-27 | Disposition: A | Discharge status: Home / Self Care | Payer: No Typology Code available for payment source | Attending: Emergency Medicine | Admitting: Emergency Medicine

## 2020-01-27 ENCOUNTER — Other Ambulatory Visit: Payer: Self-pay

## 2020-01-27 DIAGNOSIS — S43014A Anterior dislocation of right humerus, initial encounter: Secondary | ICD-10-CM | POA: Diagnosis not present

## 2020-01-27 DIAGNOSIS — Y929 Unspecified place or not applicable: Secondary | ICD-10-CM | POA: Insufficient documentation

## 2020-01-27 DIAGNOSIS — S4991XA Unspecified injury of right shoulder and upper arm, initial encounter: Secondary | ICD-10-CM | POA: Diagnosis present

## 2020-01-27 DIAGNOSIS — Z79899 Other long term (current) drug therapy: Secondary | ICD-10-CM | POA: Diagnosis not present

## 2020-01-27 DIAGNOSIS — F909 Attention-deficit hyperactivity disorder, unspecified type: Secondary | ICD-10-CM | POA: Insufficient documentation

## 2020-01-27 DIAGNOSIS — Y9301 Activity, walking, marching and hiking: Secondary | ICD-10-CM | POA: Diagnosis not present

## 2020-01-27 DIAGNOSIS — W109XXA Fall (on) (from) unspecified stairs and steps, initial encounter: Secondary | ICD-10-CM | POA: Insufficient documentation

## 2020-01-27 DIAGNOSIS — Y999 Unspecified external cause status: Secondary | ICD-10-CM | POA: Diagnosis not present

## 2020-01-27 MED ORDER — FENTANYL CITRATE (PF) 100 MCG/2ML IJ SOLN: 50.0000 ug | INTRAMUSCULAR | Status: DC

## 2020-01-27 MED ORDER — FENTANYL CITRATE (PF) 100 MCG/2ML IJ SOLN
100.0000 ug | INTRAMUSCULAR | Status: AC
  Administered 2020-01-27: 100 ug via INTRAVENOUS
  Filled 2020-01-27: qty 2

## 2020-01-27 MED ORDER — PROPOFOL 10 MG/ML IV BOLUS
1.0000 mg/kg | Freq: Once | INTRAVENOUS | Status: AC
  Administered 2020-01-27: 95.3 mg via INTRAVENOUS
  Filled 2020-01-27: qty 20

## 2020-01-27 NOTE — Discharge Instructions (Addendum)
Your right shoulder was dislocated.  You are able to get your shoulder back in place.  Please wear the sling and follow-up with orthopedic doctor.  I provided you with referral to [facility].  Please use Tylenol or ibuprofen for pain.  You may use 600 mg ibuprofen every 6 hours or 1000 mg of Tylenol every 6 hours.  You may choose to alternate between the 2.  This would be most effective.  Not to exceed 4 g of Tylenol within 24 hours.  Not to exceed 3200 mg ibuprofen 24 hours.

## 2020-01-27 NOTE — ED Notes (Addendum)
Dr. Jeraldine Loots and [place], [place], and RRT  present for procedural sedation and correcting pt right shoulder. Pt gives permission. Time out completed by all staff in room. Pt applied to cardiac monitor, co2 monitoring, ambubag and crash cart at bedisde if needed.

## 2020-01-27 NOTE — ED Triage Notes (Signed)
Patient here from home reporting right shoulder dislocation 1 hour ago after falling down stairs. Hx of same.

## 2020-01-27 NOTE — ED Provider Notes (Signed)
[facility]-EMERGENCY DEPT Provider Note   CSN: [id number] Arrival date & time: 01/27/20  1758     History Chief Complaint  Patient presents with  . Shoulder Injury  . Fall    Jimmy Ibarra is a 34 y.o. male.  HPI  Patient is a 34 year old male presented today with right shoulder pain.  He has a history of autism spectrum disorder but is highly functioning, history of shoulder dislocations in the past.  He states that his shoulder dislocated approximately 5 PM today.  He states that he was walking down the stairs and slipped somewhat and tried to catch himself by grabbing something on the wall.  He states any active dizziness he lifted his hand over his head and states that he felt a pop and sudden onset of severe achy 10/10 pain located in his right shoulder.  He denies any mitigating factors.  He states that the pain is worse with touch and movement.  He states he is unable to move his right arm at all at this time because of the pain.  He denies any other injuries no head injury or loss of consciousness no nausea or vomiting.     Past Medical History:  Diagnosis Date  . ADHD   . Autism spectrum disorder   . Depressive disorder   . History of Shoulder dislocation     Patient Active Problem List   Diagnosis Date Noted  . Low serum vitamin K 03/24/2018  . Essential hypertension 03/24/2018  . Retinal hemorrhage , acute  03/24/2018  . Autism spectrum disorder 03/24/2018    Past Surgical History:  Procedure Laterality Date  . FRACTURE SURGERY         Family History  Problem Relation Age of Onset  . Hypertension Neg Hx   . Cancer Neg Hx   . Heart disease Neg Hx   . Stroke Neg Hx     Social History   Tobacco Use  . Smoking status: Never Smoker  . Smokeless tobacco: Never Used  Substance Use Topics  . Alcohol use: Yes    Comment: socially  . Drug use: No    Home Medications Prior to Admission medications   Medication Sig Start Date  End Date Taking? Authorizing Provider  amLODipine (NORVASC) 10 MG tablet Take 1 tablet (10 mg total) by mouth daily. 07/24/19   Arvilla Market, DO  losartan (COZAAR) 25 MG tablet TAKE 1 TABLET BY MOUTH EVERY DAY 01/09/20   Arvilla Market, DO  spironolactone (ALDACTONE) 50 MG tablet Take 1 tablet (50 mg total) by mouth daily. 07/24/19   Arvilla Market, DO    Allergies    Patient has no known allergies.  Review of Systems   Review of Systems  Constitutional: Negative for chills and fever.  HENT: Negative for congestion.   Respiratory: Negative for shortness of breath.   Cardiovascular: Negative for chest pain.  Gastrointestinal: Negative for abdominal pain.  Musculoskeletal: Negative for neck pain.       Right shoulder pain    Physical Exam Updated Vital Signs BP (!) 138/91   Pulse 89   Temp 97.8 F (36.6 C) (Oral)   Resp (!) 22   Ht 6\' 2"  (1.88 m)   Wt 95.3 kg   SpO2 100%   BMI 26.96 kg/m   Physical Exam Vitals and nursing note reviewed.  Constitutional:      General: He is in acute distress.     Comments: 34 year old  male in no acute distress holding his right wrist with his left hand and guarding his right shoulder.  HENT:     Head: Normocephalic and atraumatic.     Nose: Nose normal.  Eyes:     General: No scleral icterus. Cardiovascular:     Rate and Rhythm: Normal rate and regular rhythm.     Pulses: Normal pulses.     Heart sounds: Normal heart sounds.     Comments: Bilateral radial pulses are 3+ and symmetric not tachycardic. Pulmonary:     Effort: Pulmonary effort is normal. No respiratory distress.     Breath sounds: Normal breath sounds. No wheezing.  Abdominal:     Palpations: Abdomen is soft.     Tenderness: There is no abdominal tenderness.  Musculoskeletal:     Cervical back: Normal range of motion.     Right lower leg: No edema.     Left lower leg: No edema.     Comments: Squared off deformity of the right  shoulder. Tenderness to palpation and glenohumeral joint palpated. Grips in bilateral hands are 5/5.  Skin:    General: Skin is warm and dry.     Capillary Refill: Capillary refill takes less than 2 seconds.     Comments: No bruising or swelling.  Neurological:     Mental Status: He is alert. Mental status is at baseline.     Comments: Sensation intact radial medial and ulnar distribution of bilateral hands.  Psychiatric:        Mood and Affect: Mood normal.        Behavior: Behavior normal.     ED Results / Procedures / Treatments   Labs (all labs ordered are listed, but only abnormal results are displayed) Labs Reviewed - No data to display  EKG None  Radiology DG Shoulder Right  Result Date: 01/27/2020 CLINICAL DATA:  Post reduction of RIGHT shoulder dislocation EXAM: RIGHT SHOULDER - 2+ VIEW COMPARISON:  Portable exam 1943 hours compared to 1823 hours as well as multiple prior studies FINDINGS: Previously identified anterior RIGHT glenohumeral dislocation appears reduced. 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 No additional fractures identified. AC joint alignment normal. Osseous mineralization normal. IMPRESSION: Reduction of previously identified RIGHT glenohumeral dislocation. Electronically Signed   By: Ulyses Southward M.D.   On: 01/27/2020 19:54   DG Shoulder Right  Result Date: 01/27/2020 CLINICAL DATA:  Patient here from home reporting right shoulder dislocation 1 hour ago after falling down stairs. Hx of prior right shoulder dislocation. EXAM: RIGHT SHOULDER - 2+ VIEW COMPARISON:  Right shoulder radiographs 01/20/2017 FINDINGS: There is anterior dislocation of the humeral head. There is bony irregularity at the inferior glenoid raising the possibility of a Bankart lesion. There is flattening of the posterolateral humeral head. Regional soft tissues are unremarkable. IMPRESSION: 1. Anterior dislocation of the right humeral head. 2. Depression of the  posterolateral humeral head and bony irregularity of the inferior glenoid suspicious for Hill-Sachs defect and Bankart lesion. Electronically Signed   By: Emmaline Kluver M.D.   On: 01/27/2020 18:34    Procedures Reduction of dislocation  Date/Time: 01/27/2020 10:07 PM Performed by: Gailen Shelter, PA Authorized by: Gailen Shelter, PA  Local anesthesia used: no  Anesthesia: Local anesthesia used: no  Sedation: Patient sedated: yes (Please see Dr. Priscille Loveless note for sedation.) Sedation type: moderate (conscious) sedation Sedatives: propofol Analgesia: fentanyl  Patient tolerance: patient tolerated the procedure well with no immediate complications Comments: Patient was placed in  reclined position--Dr. Jeraldine Loots and myself at bedside.  I used FARES technique to reduce the right shoulder.  There is audible clunk during reduction and postreduction films confirmed relocation of right anterior shoulder dislocation.  Patient significant provement in pain.  Improved to baseline mentation and had no immediate adverse outcomes from analgesia and sedation.    (including critical care time)  Medications Ordered in ED Medications  propofol (DIPRIVAN) 10 mg/mL bolus/IV push 95.3 mg (95.3 mg Intravenous Given 01/27/20 1930)  fentaNYL (SUBLIMAZE) injection 100 mcg (100 mcg Intravenous Given 01/27/20 1850)    ED Course  I have reviewed the triage vital signs and the nursing notes.  Pertinent labs & imaging results that were available during my care of the patient were reviewed by me and considered in my medical decision making (see chart for details).    MDM Rules/Calculators/A&P                          Patient is 34 year old male with right shoulder pain with confirmed right shoulder dislocation.  Exam notable for right shoulder deformity and tenderness palpation consistent with anterior shoulder dislocation.  X-ray shows anterior dislocation of the shoulder.  Agree of radiology  read.  Shared decision-making a physician with patient about method of reduction.  He would prefer to be sedated.  Dr. Jeraldine Loots provided timeout.  Reduction was conducted by myself and sedation by Dr. Jeraldine Loots.  I discussed with patient that he needs to follow-up with orthopedics.  He will use Tylenol ibuprofen at home for pain.  Placed in shoulder immobilizer.   Final Clinical Impression(s) / ED Diagnoses Final diagnoses:  Anterior dislocation of right shoulder, initial encounter    Rx / DC Orders ED Discharge Orders    None       Gailen Shelter, [place] 01/27/20 2211    Gerhard Munch, MD 01/28/20 [phone]

## 2020-01-28 NOTE — ED Provider Notes (Signed)
.  Sedation  Date/Time: 01/27/2020 7:30 PM Performed by: Gerhard Munch, MD Authorized by: Gerhard Munch, MD   Consent:    Consent obtained:  Verbal   Consent given by:  Patient   Risks discussed:  Allergic reaction, dysrhythmia, inadequate sedation, nausea, prolonged hypoxia resulting in organ damage, prolonged sedation necessitating reversal, respiratory compromise necessitating ventilatory assistance and intubation and vomiting   Alternatives discussed:  Analgesia without sedation, anxiolysis and regional anesthesia Universal protocol:    Procedure explained and questions answered to patient or proxy's satisfaction: yes     Relevant documents present and verified: yes     Test results available and properly labeled: yes     Imaging studies available: yes     Required blood products, implants, devices, and special equipment available: yes     Site/side marked: yes     Immediately prior to procedure a time out was called: yes     Patient identity confirmation method:  Verbally with patient Indications:    Procedure necessitating sedation performed by:  Physician performing sedation (and physician assistant) Pre-sedation assessment:    Time since last food or drink:  3   ASA classification: class 2 - patient with mild systemic disease     Neck mobility: normal     Mouth opening:  2 finger widths   Thyromental distance:  3 finger widths   Mallampati score:  I - soft palate, uvula, fauces, pillars visible   Pre-sedation assessments completed and reviewed: airway patency, cardiovascular function, hydration status, mental status, nausea/vomiting, pain level, respiratory function and temperature     Pre-sedation assessment completed:  01/27/2020 7:30 PM Immediate pre-procedure details:    Reassessment: Patient reassessed immediately prior to procedure     Reviewed: vital signs, relevant labs/tests and NPO status     Verified: bag valve mask available, emergency equipment available,  intubation equipment available, IV patency confirmed, oxygen available and suction available   Procedure details (see MAR for exact dosages):    Preoxygenation:  Nasal cannula   Sedation:  Propofol   Intended level of sedation: deep   Intra-procedure monitoring:  Blood pressure monitoring, cardiac monitor, continuous pulse oximetry, frequent LOC assessments, frequent vital sign checks and continuous capnometry   Intra-procedure events: none     Total Provider sedation time (minutes):  20 Post-procedure details:    Post-sedation assessment completed:  01/28/2020 8:00 PM   Attendance: Constant attendance by certified staff until patient recovered     Recovery: Patient returned to pre-procedure baseline     Post-sedation assessments completed and reviewed: airway patency, cardiovascular function, hydration status, mental status, nausea/vomiting, pain level, respiratory function and temperature     Patient is stable for discharge or admission: yes     Patient tolerance:  Tolerated well, no immediate complications      Gerhard Munch, MD 01/28/20 1609

## 2020-02-01 NOTE — Progress Notes (Signed)
Patient cancelled visit. Opened in error.

## 2020-02-26 ENCOUNTER — Telehealth (INDEPENDENT_AMBULATORY_CARE_PROVIDER_SITE_OTHER): Payer: Self-pay

## 2020-02-26 DIAGNOSIS — I10 Essential (primary) hypertension: Secondary | ICD-10-CM

## 2020-02-26 MED ORDER — AMLODIPINE BESYLATE 10 MG PO TABS: 10.0000 mg | ORAL_TABLET | Freq: Every day | ORAL | 3 refills | Status: DC

## 2020-02-26 MED ORDER — SPIRONOLACTONE 50 MG PO TABS: 50.0000 mg | ORAL_TABLET | Freq: Every day | ORAL | 3 refills | Status: DC

## 2020-02-26 MED ORDER — LOSARTAN POTASSIUM 25 MG PO TABS: 25.0000 mg | ORAL_TABLET | Freq: Every day | ORAL | 3 refills | Status: DC

## 2020-02-26 NOTE — Progress Notes (Signed)
Established Patient Office Visit  Subjective:  Patient ID: Jimmy Ibarra, male    DOB: [birth year]  Age: 34 y.o. MRN: [id number]  CC:  Chief Complaint  Patient presents with  . Hypertension    BP readings at home have been in the normal range. denies chest pain, headaches, SHOB, palpitations, dizziness, lower extremity swelling. is ok on refills     Virtual Visit via Telephone Note  I connected with Jimmy Ibarra on 02/26/20 at  2:10 PM EDT by telephone and verified that I am speaking with the correct person using two identifiers.  Location: Patient: Home Provider: working remotely from home   I discussed the limitations, risks, security and privacy concerns of performing an evaluation and management service by telephone and the availability of in person appointments. I also discussed with the patient that there may be a patient responsible charge related to this service. The patient expressed understanding and agreed to proceed.   History of Present Illness: HYPERTENSION  Patient here for follow-up of elevated blood pressure.  Reports that he has been compliant to medications, readings at home are within normal limits.  No new complaints at this time  Fully vaccinated against COVID-19 with [employer].    Observations/Objective: Medical history and current medications reviewed, no physical exam completed    Past Medical History:  Diagnosis Date  . ADHD   . Autism spectrum disorder   . Depressive disorder   . History of Shoulder dislocation   . Hypertension    [employer] 02/26/2020    Past Surgical History:  Procedure Laterality Date  . FRACTURE SURGERY      Family History  Problem Relation Age of Onset  . Hypertension Neg Hx   . Cancer Neg Hx   . Heart disease Neg Hx   . Stroke Neg Hx     Social History   Socioeconomic History  . Marital status: Married    Spouse name: Not on file  . Number of children: Not on file  . Years of education: Not on file   . Highest education level: Not on file  Occupational History  . Not on file  Tobacco Use  . Smoking status: Never Smoker  . Smokeless tobacco: Never Used  Substance and Sexual Activity  . Alcohol use: Yes    Comment: socially  . Drug use: No  . Sexual activity: Not on file  Other Topics Concern  . Not on file  Social History Narrative  . Not on file   Social Determinants of Health   Financial Resource Strain:   . Difficulty of Paying Living Expenses: Not on file  Food Insecurity:   . Worried About Programme researcher, broadcasting/film/video in the Last Year: Not on file  . Ran Out of Food in the Last Year: Not on file  Transportation Needs:   . Lack of Transportation (Medical): Not on file  . Lack of Transportation (Non-Medical): Not on file  Physical Activity:   . Days of Exercise per Week: Not on file  . Minutes of Exercise per Session: Not on file  Stress:   . Feeling of Stress : Not on file  Social Connections:   . Frequency of Communication with Friends and Family: Not on file  . Frequency of Social Gatherings with Friends and Family: Not on file  . Attends Religious Services: Not on file  . Active Member of Clubs or Organizations: Not on file  . Attends Banker Meetings: Not on file  .  Marital Status: Not on file  Intimate Partner Violence:   . Fear of Current or Ex-Partner: Not on file  . Emotionally Abused: Not on file  . Physically Abused: Not on file  . Sexually Abused: Not on file    Outpatient Medications Prior to Visit  Medication Sig Dispense Refill  . amLODipine (NORVASC) 10 MG tablet Take 1 tablet (10 mg total) by mouth daily. 90 tablet 3  . losartan (COZAAR) 25 MG tablet TAKE 1 TABLET BY MOUTH EVERY DAY 90 tablet 0  . spironolactone (ALDACTONE) 50 MG tablet Take 1 tablet (50 mg total) by mouth daily. 90 tablet 3   No facility-administered medications prior to visit.    No Known Allergies  ROS Review of Systems  Constitutional: Negative.   HENT:  Negative.   Eyes: Negative.   Respiratory: Negative.  Negative for shortness of breath.   Cardiovascular: Negative for chest pain.  Gastrointestinal: Negative.   Endocrine: Negative.   Genitourinary: Negative.   Musculoskeletal: Negative.   Skin: Negative.   Allergic/Immunologic: Negative.   Neurological: Negative.   Hematological: Negative.   Psychiatric/Behavioral: Negative.       Objective:     There were no vitals taken for this visit. Wt Readings from Last 3 Encounters:  01/27/20 210 lb (95.3 kg)  08/21/19 216 lb (98 kg)  07/24/19 220 lb 6.4 oz (100 kg)     Health Maintenance Due  Topic Date Due  . Hepatitis C Screening  Never done  . INFLUENZA VACCINE  Never done    There are no preventive care reminders to display for this patient.  Lab Results  Component Value Date   TSH 0.815 03/24/2018   Lab Results  Component Value Date   WBC 8.7 03/21/2018   HGB 16.2 03/21/2018   HCT 47.1 03/21/2018   MCV 84.7 03/21/2018   PLT 268 03/21/2018   Lab Results  Component Value Date   NA 140 07/24/2019   K 4.1 07/24/2019   CO2 24 07/24/2019   GLUCOSE 94 07/24/2019   BUN 16 07/24/2019   CREATININE 1.16 07/24/2019   CALCIUM 9.4 07/24/2019   ANIONGAP 9 03/21/2018   No results found for: CHOL No results found for: HDL No results found for: LDLCALC No results found for: TRIG No results found for: CHOLHDL Lab Results  Component Value Date   HGBA1C 5.1 03/24/2018      Assessment & Plan:   Problem List Items Addressed This Visit      Cardiovascular and Mediastinum   Essential hypertension - Primary   Relevant Medications   amLODipine (NORVASC) 10 MG tablet   losartan (COZAAR) 25 MG tablet   spironolactone (ALDACTONE) 50 MG tablet   Other Relevant Orders   Basic Metabolic Panel     Assessment and Plan: 1. Essential hypertension Continue current regimen, encourage patient to come in for labs and blood pressure check.  8-month follow-up with Dr.  Earlene Plater - amLODipine (NORVASC) 10 MG tablet; Take 1 tablet (10 mg total) by mouth daily.  Dispense: 90 tablet; Refill: 3 - losartan (COZAAR) 25 MG tablet; Take 1 tablet (25 mg total) by mouth daily.  Dispense: 90 tablet; Refill: 3 - spironolactone (ALDACTONE) 50 MG tablet; Take 1 tablet (50 mg total) by mouth daily.  Dispense: 90 tablet; Refill: 3 - Basic Metabolic Panel; Future   Follow Up Instructions:    I discussed the assessment and treatment plan with the patient. The patient was provided an opportunity to ask questions and  all were answered. The patient agreed with the plan and demonstrated an understanding of the instructions.   The patient was advised to call back or seek an in-person evaluation if the symptoms worsen or if the condition fails to improve as anticipated.  I provided 21  minutes of non-face-to-face time during this encounter.   Chicquita Mendel S Mayers, PA-C    Meds ordered this encounter  Medications  . amLODipine (NORVASC) 10 MG tablet    Sig: Take 1 tablet (10 mg total) by mouth daily.    Dispense:  90 tablet    Refill:  3    Order Specific Question:   Supervising Provider    Answer:   Delford Field, PATRICK E [[id number]]  . losartan (COZAAR) 25 MG tablet    Sig: Take 1 tablet (25 mg total) by mouth daily.    Dispense:  90 tablet    Refill:  3    Order Specific Question:   Supervising Provider    Answer:   Delford Field, PATRICK E [[id number]]  . spironolactone (ALDACTONE) 50 MG tablet    Sig: Take 1 tablet (50 mg total) by mouth daily.    Dispense:  90 tablet    Refill:  3    Order Specific Question:   Supervising Provider    Answer:   Storm Frisk [[id number]]    Follow-up: Return in about 6 months (around 08/25/2020) for with Dr. Earlene Plater at [facility].    Kasandra Knudsen Mayers, PA-C

## 2020-03-04 ENCOUNTER — Other Ambulatory Visit (INDEPENDENT_AMBULATORY_CARE_PROVIDER_SITE_OTHER): Payer: Self-pay

## 2020-03-04 ENCOUNTER — Other Ambulatory Visit: Payer: Self-pay

## 2020-03-04 DIAGNOSIS — I10 Essential (primary) hypertension: Secondary | ICD-10-CM

## 2020-03-05 LAB — BASIC METABOLIC PANEL
BUN/Creatinine Ratio: 14 (ref 9–20)
BUN: 15 mg/dL (ref 6–20)
CO2: 21 mmol/L (ref 20–29)
Calcium: 9.3 mg/dL (ref 8.7–10.2)
Chloride: 99 mmol/L (ref 96–106)
Creatinine, Ser: 1.08 mg/dL (ref 0.76–1.27)
GFR calc Af Amer: 104 mL/min/{1.73_m2} (ref >59)
GFR calc non Af Amer: 90 mL/min/{1.73_m2} (ref >59)
Glucose: 187 mg/dL — ABNORMAL HIGH (ref 65–99)
Potassium: 4 mmol/L (ref 3.5–5.2)
Sodium: 136 mmol/L (ref 134–144)

## 2020-03-11 ENCOUNTER — Telehealth: Payer: Self-pay | Admitting: *Deleted

## 2020-03-11 ENCOUNTER — Other Ambulatory Visit: Payer: Self-pay | Admitting: Physician Assistant

## 2020-03-11 DIAGNOSIS — R7309 Other abnormal glucose: Secondary | ICD-10-CM

## 2020-03-11 NOTE — Telephone Encounter (Signed)
-----   Message from Roney Jaffe, [place] sent at 03/11/2020 11:58 AM EDT ----- Please call patient and let him know that his kidney/liver function were WNL. However he did have an elevated blood glucose level, he needs to have an A1C check.  He can come to mobile unit or PCE for that.

## 2020-03-11 NOTE — Telephone Encounter (Signed)
Patient verified DOB Patient is aware of labs being normal except glucose level being elevated. Patient is scheduled for Wednesday 10/13 at 2pm for an A1C check and follow up.

## 2020-03-13 ENCOUNTER — Other Ambulatory Visit: Payer: Self-pay

## 2020-03-13 ENCOUNTER — Ambulatory Visit (INDEPENDENT_AMBULATORY_CARE_PROVIDER_SITE_OTHER): Payer: Self-pay

## 2020-03-13 DIAGNOSIS — Z131 Encounter for screening for diabetes mellitus: Secondary | ICD-10-CM

## 2020-03-13 DIAGNOSIS — R7309 Other abnormal glucose: Secondary | ICD-10-CM

## 2020-03-13 LAB — POCT GLYCOSYLATED HEMOGLOBIN (HGB A1C): Hemoglobin A1C: 5.2 % (ref 4.0–5.6)

## 2020-03-13 NOTE — Progress Notes (Signed)
Patient here for A1C after having elevated random glucose level. KWalker, CMA.

## 2020-08-26 ENCOUNTER — Other Ambulatory Visit: Payer: Self-pay

## 2020-08-26 ENCOUNTER — Encounter: Payer: Self-pay | Admitting: Internal Medicine

## 2020-08-26 ENCOUNTER — Ambulatory Visit (INDEPENDENT_AMBULATORY_CARE_PROVIDER_SITE_OTHER): Payer: Self-pay | Admitting: Internal Medicine

## 2020-08-26 VITALS — BP 147/95 | HR 76 | Resp 16 | Wt 223.8 lb

## 2020-08-26 DIAGNOSIS — I10 Essential (primary) hypertension: Secondary | ICD-10-CM

## 2020-08-26 DIAGNOSIS — E663 Overweight: Secondary | ICD-10-CM

## 2020-08-26 NOTE — Progress Notes (Signed)
Pt states he think he has a weight issue

## 2020-08-26 NOTE — Patient Instructions (Signed)
[email or web address]">  DASH Eating Plan DASH stands for Dietary Approaches to Stop Hypertension. The DASH eating plan is a healthy eating plan that has been shown to:  Reduce high blood pressure (hypertension).  Reduce your risk for type 2 diabetes, heart disease, and stroke.  Help with weight loss. What are tips for following this plan? Reading food labels  Check food labels for the amount of salt (sodium) per serving. Choose foods with less than 5 percent of the Daily Value of sodium. Generally, foods with less than 300 milligrams (mg) of sodium per serving fit into this eating plan.  To find whole grains, look for the word "whole" as the first word in the ingredient list. Shopping  Buy products labeled as "low-sodium" or "no salt added."  Buy fresh foods. Avoid canned foods and pre-made or frozen meals. Cooking  Avoid adding salt when cooking. Use salt-free seasonings or herbs instead of table salt or sea salt. Check with your health care provider or pharmacist before using salt substitutes.  Do not fry foods. Cook foods using healthy methods such as baking, boiling, grilling, roasting, and broiling instead.  Cook with heart-healthy oils, such as olive, canola, avocado, soybean, or sunflower oil. Meal planning  Eat a balanced diet that includes: ? 4 or more servings of fruits and 4 or more servings of vegetables each day. Try to fill one-half of your plate with fruits and vegetables. ? 6-8 servings of whole grains each day. ? Less than 6 oz (170 g) of lean meat, poultry, or fish each day. A 3-oz (85-g) serving of meat is about the same size as a deck of cards. One egg equals 1 oz (28 g). ? 2-3 servings of low-fat dairy each day. One serving is 1 cup (237 mL). ? 1 serving of nuts, seeds, or beans 5 times each week. ? 2-3 servings of heart-healthy fats. Healthy fats called omega-3 fatty acids are found in foods such as walnuts,  flaxseeds, fortified milks, and eggs. These fats are also found in cold-water fish, such as sardines, salmon, and mackerel.  Limit how much you eat of: ? Canned or prepackaged foods. ? Food that is high in trans fat, such as some fried foods. ? Food that is high in saturated fat, such as fatty meat. ? Desserts and other sweets, sugary drinks, and other foods with added sugar. ? Full-fat dairy products.  Do not salt foods before eating.  Do not eat more than 4 egg yolks a week.  Try to eat at least 2 vegetarian meals a week.  Eat more home-cooked food and less restaurant, buffet, and fast food.   Lifestyle  When eating at a restaurant, ask that your food be prepared with less salt or no salt, if possible.  If you drink alcohol: ? Limit how much you use to:  0-1 drink a day for women who are not pregnant.  0-2 drinks a day for men. ? Be aware of how much alcohol is in your drink. In the [place] one drink equals one 12 oz bottle of beer (355 mL), one 5 oz glass of wine (148 mL), or one 1 oz glass of hard liquor (44 mL). General information  Avoid eating more than 2,300 mg of salt a day. If you have hypertension, you may need to reduce your sodium intake to 1,500 mg a day.  Work with your health care provider to maintain a healthy body weight or to lose weight. Ask what an ideal weight is for  you.  Get at least 30 minutes of exercise that causes your heart to beat faster (aerobic exercise) most days of the week. Activities may include walking, swimming, or biking.  Work with your health care provider or dietitian to adjust your eating plan to your individual calorie needs. What foods should I eat? Fruits All fresh, dried, or frozen fruit. Canned fruit in natural juice (without added sugar). Vegetables Fresh or frozen vegetables (raw, steamed, roasted, or grilled). Low-sodium or reduced-sodium tomato and vegetable juice. Low-sodium or reduced-sodium tomato sauce and tomato paste.  Low-sodium or reduced-sodium canned vegetables. Grains Whole-grain or whole-wheat bread. Whole-grain or whole-wheat pasta. Brown rice. [place]. Bulgur. Whole-grain and low-sodium cereals. Pita bread. Low-fat, low-sodium crackers. Whole-wheat flour tortillas. Meats and other proteins Skinless chicken or [place]. Ground chicken or [place]. Pork with fat trimmed off. Fish and seafood. Egg whites. Dried beans, peas, or lentils. Unsalted nuts, nut butters, and seeds. Unsalted canned beans. Lean cuts of beef with fat trimmed off. Low-sodium, lean precooked or cured meat, such as sausages or meat loaves. Dairy Low-fat (1%) or fat-free (skim) milk. Reduced-fat, low-fat, or fat-free cheeses. Nonfat, low-sodium ricotta or cottage cheese. Low-fat or nonfat yogurt. Low-fat, low-sodium cheese. Fats and oils Soft margarine without trans fats. Vegetable oil. Reduced-fat, low-fat, or light mayonnaise and salad dressings (reduced-sodium). Canola, safflower, olive, avocado, soybean, and sunflower oils. Avocado. Seasonings and condiments Herbs. Spices. Seasoning mixes without salt. Other foods Unsalted popcorn and pretzels. Fat-free sweets. The items listed above may not be a complete list of foods and beverages you can eat. Contact a dietitian for more information. What foods should I avoid? Fruits Canned fruit in a light or heavy syrup. Fried fruit. Fruit in cream or butter sauce. Vegetables Creamed or fried vegetables. Vegetables in a cheese sauce. Regular canned vegetables (not low-sodium or reduced-sodium). Regular canned tomato sauce and paste (not low-sodium or reduced-sodium). Regular tomato and vegetable juice (not low-sodium or reduced-sodium). Angie Fava. Olives. Grains Baked goods made with fat, such as croissants, muffins, or some breads. Dry pasta or rice meal packs. Meats and other proteins Fatty cuts of meat. Ribs. Fried meat. Berniece Salines. Bologna, salami, and other precooked or cured meats, such as  sausages or meat loaves. Fat from the back of a pig (fatback). Bratwurst. Salted nuts and seeds. Canned beans with added salt. Canned or smoked fish. Whole eggs or egg yolks. Chicken or [place] with skin. Dairy Whole or 2% milk, cream, and half-and-half. Whole or full-fat cream cheese. Whole-fat or sweetened yogurt. Full-fat cheese. Nondairy creamers. Whipped toppings. Processed cheese and cheese spreads. Fats and oils Butter. Stick margarine. Lard. Shortening. Ghee. Bacon fat. Tropical oils, such as coconut, palm kernel, or palm oil. Seasonings and condiments Onion salt, garlic salt, seasoned salt, table salt, and sea salt. Worcestershire sauce. Tartar sauce. Barbecue sauce. Teriyaki sauce. Soy sauce, including reduced-sodium. Steak sauce. Canned and packaged gravies. Fish sauce. Oyster sauce. Cocktail sauce. Store-bought horseradish. Ketchup. Mustard. Meat flavorings and tenderizers. Bouillon cubes. Hot sauces. Pre-made or packaged marinades. Pre-made or packaged taco seasonings. Relishes. Regular salad dressings. Other foods Salted popcorn and pretzels. The items listed above may not be a complete list of foods and beverages you should avoid. Contact a dietitian for more information. Where to find more information  National Heart, Lung, and Blood Institute: https://wilson-eaton.com/  American Heart Association: www.heart.org  Academy of Nutrition and Dietetics: www.eatright.[place]: www.kidney.org Summary  The DASH eating plan is a healthy eating plan that has been shown to  reduce high blood pressure (hypertension). It may also reduce your risk for type 2 diabetes, heart disease, and stroke.  When on the DASH eating plan, aim to eat more fresh fruits and vegetables, whole grains, lean proteins, low-fat dairy, and heart-healthy fats.  With the DASH eating plan, you should limit salt (sodium) intake to 2,300 mg a day. If you have hypertension, you may need to reduce your  sodium intake to 1,500 mg a day.  Work with your health care provider or dietitian to adjust your eating plan to your individual calorie needs. This information is not intended to replace advice given to you by your health care provider. Make sure you discuss any questions you have with your health care provider. Document Revised: 04/21/2019 Document Reviewed: 04/21/2019 Elsevier Patient Education  2021 [place] Following a healthy eating pattern may help you to achieve and maintain a healthy body weight, reduce the risk of chronic disease, and live a long and productive life. It is important to follow a healthy eating pattern at an appropriate calorie level for your body. Your nutritional needs should be met primarily through food by choosing a variety of nutrient-rich foods. What are tips for following this plan? Reading food labels  Read labels and choose the following: ? Reduced or low sodium. ? Juices with 100% fruit juice. ? Foods with low saturated fats and high polyunsaturated and monounsaturated fats. ? Foods with whole grains, such as whole wheat, cracked wheat, brown rice, and wild rice. ? Whole grains that are fortified with folic acid. This is recommended for women who are pregnant or who want to become pregnant.  Read labels and avoid the following: ? Foods with a lot of added sugars. These include foods that contain brown sugar, corn sweetener, corn syrup, dextrose, fructose, glucose, high-fructose corn syrup, honey, invert sugar, lactose, malt syrup, maltose, molasses, raw sugar, sucrose, trehalose, or turbinado sugar.  Do not eat more than the following amounts of added sugar per day:  6 teaspoons (25 g) for women.  9 teaspoons (38 g) for men. ? Foods that contain processed or refined starches and grains. ? Refined grain products, such as white flour, degermed cornmeal, white bread, and white rice. Shopping  Choose nutrient-rich snacks, such as  vegetables, whole fruits, and nuts. Avoid high-calorie and high-sugar snacks, such as potato chips, fruit snacks, and candy.  Use oil-based dressings and spreads on foods instead of solid fats such as butter, stick margarine, or cream cheese.  Limit pre-made sauces, mixes, and "instant" products such as flavored rice, instant noodles, and ready-made pasta.  Try more plant-protein sources, such as tofu, tempeh, black beans, edamame, lentils, nuts, and seeds.  Explore eating plans such as the Mediterranean diet or vegetarian diet. Cooking  Use oil to saut or stir-fry foods instead of solid fats such as butter, stick margarine, or lard.  Try baking, boiling, grilling, or broiling instead of frying.  Remove the fatty part of meats before cooking.  Steam vegetables in water or broth. Meal planning  At meals, imagine dividing your plate into fourths: ? One-half of your plate is fruits and vegetables. ? One-fourth of your plate is whole grains. ? One-fourth of your plate is protein, especially lean meats, poultry, eggs, tofu, beans, or nuts.  Include low-fat dairy as part of your daily diet.   Lifestyle  Choose healthy options in all settings, including home, work, school, restaurants, or stores.  Prepare your food safely: ? Wash your  hands after handling raw meats. ? Keep food preparation surfaces clean by regularly washing with hot, soapy water. ? Keep raw meats separate from ready-to-eat foods, such as fruits and vegetables. ? Cook seafood, meat, poultry, and eggs to the recommended internal temperature. ? Store foods at safe temperatures. In general:  Keep cold foods at 108F (4.4C) or below.  Keep hot foods at 1108F (60C) or above.  Keep your freezer at [facility] (-17.8C) or below.  Foods are no longer safe to eat when they have been between the temperatures of 40-1108F (4.4-60C) for more than 2 hours. What foods should I eat? Fruits Aim to eat 2 cup-equivalents of fresh,  canned (in natural juice), or frozen fruits each day. Examples of 1 cup-equivalent of fruit include 1 small apple, 8 large strawberries, 1 cup canned fruit,  cup dried fruit, or 1 cup 100% juice. Vegetables Aim to eat 2-3 cup-equivalents of fresh and frozen vegetables each day, including different varieties and colors. Examples of 1 cup-equivalent of vegetables include 2 medium carrots, 2 cups raw, leafy greens, 1 cup chopped vegetable (raw or cooked), or 1 medium baked potato. Grains Aim to eat 6 ounce-equivalents of whole grains each day. Examples of 1 ounce-equivalent of grains include 1 slice of bread, 1 cup ready-to-eat cereal, 3 cups popcorn, or  cup cooked rice, pasta, or cereal. Meats and other proteins Aim to eat 5-6 ounce-equivalents of protein each day. Examples of 1 ounce-equivalent of protein include 1 egg, 1/2 cup nuts or seeds, or 1 tablespoon (16 g) peanut butter. A cut of meat or fish that is the size of a deck of cards is about 3-4 ounce-equivalents.  Of the protein you eat each week, try to have at least 8 ounces come from seafood. This includes salmon, trout, herring, and anchovies. Dairy Aim to eat 3 cup-equivalents of fat-free or low-fat dairy each day. Examples of 1 cup-equivalent of dairy include 1 cup (240 mL) milk, 8 ounces (250 g) yogurt, 1 ounces (44 g) natural cheese, or 1 cup (240 mL) fortified soy milk. Fats and oils  Aim for about 5 teaspoons (21 g) per day. Choose monounsaturated fats, such as canola and olive oils, avocados, peanut butter, and most nuts, or polyunsaturated fats, such as sunflower, corn, and soybean oils, walnuts, pine nuts, sesame seeds, sunflower seeds, and flaxseed. Beverages  Aim for six 8-oz glasses of water per day. Limit coffee to three to five 8-oz cups per day.  Limit caffeinated beverages that have added calories, such as soda and energy drinks.  Limit alcohol intake to no more than 1 drink a day for nonpregnant women and 2 drinks a  day for men. One drink equals 12 oz of beer (355 mL), 5 oz of wine (148 mL), or 1 oz of hard liquor (44 mL). Seasoning and other foods  Avoid adding excess amounts of salt to your foods. Try flavoring foods with herbs and spices instead of salt.  Avoid adding sugar to foods.  Try using oil-based dressings, sauces, and spreads instead of solid fats. This information is based on general [place] nutrition guidelines. For more information, visit BuildDNA.es. Exact amounts may vary based on your nutrition needs. Summary  A healthy eating plan may help you to maintain a healthy weight, reduce the risk of chronic diseases, and stay active throughout your life.  Plan your meals. Make sure you eat the right portions of a variety of nutrient-rich foods.  Try baking, boiling, grilling, or broiling instead of frying.  Choose healthy options in all settings, including home, work, school, restaurants, or stores. This information is not intended to replace advice given to you by your health care provider. Make sure you discuss any questions you have with your health care provider. Document Revised: 08/30/2017 Document Reviewed: 08/30/2017 Elsevier Patient Education  [place].

## 2020-08-26 NOTE — Progress Notes (Signed)
  Subjective:    Jimmy Ibarra - 35 y.o. male MRN [id number]  Date of birth: 1985-09-29  HPI  Jimmy Ibarra is here for HTN f/u. 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 Would like advice about diet and exercise. He bikes, walks dogs, does a lot of activity on his feet. Has been working from home this past 1-2 months and does find more sedentary with this change.   Chronic HTN Disease Monitoring:  Home BP Monitoring - Does not monitor at home  Chest pain- no  Dyspnea- no Headache - no  Medications: Amlodipine 10 mg, Losartan 25 mg, Spironolactone 50 mg  Compliance- yes Lightheadedness- no  Edema- no    Health Maintenance:  Health Maintenance Due  Topic Date Due  . Hepatitis C Screening  Never done  . HIV Screening  Never done  . TETANUS/TDAP  Never done  . INFLUENZA VACCINE  Never done    -  reports that he has never smoked. He has never used smokeless tobacco. - Review of Systems: Per HPI. - Past Medical History: Patient Active Problem List   Diagnosis Date Noted  . Essential hypertension 03/24/2018  . Retinal hemorrhage , acute  03/24/2018  . Autism spectrum disorder 03/24/2018   - Medications: reviewed and updated   Objective:   Physical Exam BP (!) 147/95   Pulse 76   Resp 16   Wt 223 lb 12.8 oz (101.5 kg)   SpO2 95%   BMI 28.73 kg/m  Physical Exam Constitutional:      General: He is not in acute distress.    Appearance: He is not diaphoretic.  HENT:     Head: Normocephalic and atraumatic.  Eyes:     Conjunctiva/sclera: Conjunctivae normal.  Cardiovascular:     Rate and Rhythm: Normal rate and regular rhythm.     Heart sounds: Normal heart sounds. No murmur heard.   Pulmonary:     Effort: Pulmonary effort is normal. No respiratory distress.     Breath sounds: Normal breath sounds.  Musculoskeletal:        General: Normal range of motion.  Skin:    General: Skin is warm and dry.  Neurological:     Mental Status: He is alert and oriented  to person, place, and time.  Psychiatric:        Mood and Affect: Affect normal.        Judgment: Judgment normal.        Assessment & Plan:    1. Essential hypertension BP 147/95, repeat 136/86. No changes to current medication. Asymptomatic. Monitor BMET with medication regimen.  - Basic Metabolic Panel  2. Overweight (BMI 25.0-29.9) Discussed ideal BMI. Advised on diet and exercise. Handouts provided. Discussed that diet and exercise will also help to control HTN. F/u in 3 months.    Marcy Siren, D.O. 08/26/2020, 2:03 PM [facility] at [facility]

## 2020-08-27 LAB — BASIC METABOLIC PANEL
BUN/Creatinine Ratio: 17 (ref 9–20)
BUN: 17 mg/dL (ref 6–20)
CO2: 20 mmol/L (ref 20–29)
Calcium: 9.4 mg/dL (ref 8.7–10.2)
Chloride: 103 mmol/L (ref 96–106)
Creatinine, Ser: 1.01 mg/dL (ref 0.76–1.27)
Glucose: 89 mg/dL (ref 65–99)
Potassium: 4.2 mmol/L (ref 3.5–5.2)
Sodium: 141 mmol/L (ref 134–144)
eGFR: 100 mL/min/{1.73_m2} (ref >59)

## 2020-08-30 ENCOUNTER — Encounter: Payer: Self-pay | Admitting: *Deleted

## 2020-11-24 NOTE — Progress Notes (Signed)
Patient did not show for appointment.   

## 2020-11-25 ENCOUNTER — Encounter: Payer: Self-pay | Admitting: Family

## 2021-01-18 ENCOUNTER — Other Ambulatory Visit: Payer: Self-pay | Admitting: Physician Assistant

## 2021-01-18 DIAGNOSIS — I10 Essential (primary) hypertension: Secondary | ICD-10-CM

## 2021-02-07 ENCOUNTER — Ambulatory Visit (INDEPENDENT_AMBULATORY_CARE_PROVIDER_SITE_OTHER): Payer: Self-pay

## 2021-02-07 ENCOUNTER — Other Ambulatory Visit: Payer: Self-pay

## 2021-02-07 DIAGNOSIS — Z23 Encounter for immunization: Secondary | ICD-10-CM

## 2021-02-07 NOTE — Progress Notes (Signed)
    [facility]  Name:  Jimmy Ibarra    MRN: [id number] DOB: [birth year]   02/07/2021  [name] was observed post JYNNEOS immunization for 15 minutes without incident. He was provided with Vaccine Information Sheet and instruction to access the V-Safe system.   [name] was instructed to call 911 with any severe reactions post vaccine: Difficulty breathing  Swelling of face and throat  A fast heartbeat  A bad rash all over body  Dizziness and weakness

## 2021-03-21 ENCOUNTER — Ambulatory Visit: Payer: Self-pay

## 2021-03-26 ENCOUNTER — Other Ambulatory Visit: Payer: Self-pay | Admitting: Physician Assistant

## 2021-03-26 DIAGNOSIS — I10 Essential (primary) hypertension: Secondary | ICD-10-CM

## 2021-04-01 ENCOUNTER — Other Ambulatory Visit: Payer: Self-pay

## 2021-04-01 ENCOUNTER — Encounter: Payer: Self-pay | Admitting: Family Medicine

## 2021-04-01 ENCOUNTER — Ambulatory Visit (INDEPENDENT_AMBULATORY_CARE_PROVIDER_SITE_OTHER): Payer: Self-pay | Admitting: Family Medicine

## 2021-04-01 VITALS — BP 131/89 | HR 69 | Temp 98.0°F | Resp 16 | Wt 222.4 lb

## 2021-04-01 DIAGNOSIS — Z114 Encounter for screening for human immunodeficiency virus [HIV]: Secondary | ICD-10-CM

## 2021-04-01 DIAGNOSIS — I10 Essential (primary) hypertension: Secondary | ICD-10-CM

## 2021-04-01 MED ORDER — SPIRONOLACTONE 50 MG PO TABS: 50.0000 mg | ORAL_TABLET | Freq: Every day | ORAL | 1 refills | Status: DC

## 2021-04-01 MED ORDER — AMLODIPINE BESYLATE 10 MG PO TABS: 10.0000 mg | ORAL_TABLET | Freq: Every day | ORAL | 1 refills | Status: DC

## 2021-04-01 MED ORDER — LOSARTAN POTASSIUM 25 MG PO TABS: 25.0000 mg | ORAL_TABLET | Freq: Every day | ORAL | 1 refills | Status: DC

## 2021-04-01 NOTE — Progress Notes (Signed)
Established Patient Office Visit  Subjective:  Patient ID: Jimmy Ibarra, male    DOB: [birth year]  Age: 35 y.o. MRN: [id number]  CC:  Chief Complaint  Patient presents with   Follow-up   Labs Only    HPI Jimmy Ibarra [place] presents for follow up of hypertension. He reports that he has not been taking but two of his meds as he had a hard time getting the third med refilled. He also wants an HIV test as his new partner wants him to be tested before they start being sexually intimate.   Past Medical History:  Diagnosis Date   ADHD    Autism spectrum disorder    Depressive disorder    History of Shoulder dislocation    Hypertension    Phreesia 02/26/2020    Past Surgical History:  Procedure Laterality Date   FRACTURE SURGERY      Family History  Problem Relation Age of Onset   Hypertension Neg Hx    Cancer Neg Hx    Heart disease Neg Hx    Stroke Neg Hx     Social History   Socioeconomic History   Marital status: Single    Spouse name: Not on file   Number of children: Not on file   Years of education: Not on file   Highest education level: Not on file  Occupational History   Not on file  Tobacco Use   Smoking status: Never   Smokeless tobacco: Never  Substance and Sexual Activity   Alcohol use: Yes    Comment: socially   Drug use: No   Sexual activity: Not on file  Other Topics Concern   Not on file  Social History Narrative   Not on file   Social Determinants of Health   Financial Resource Strain: Not on file  Food Insecurity: Not on file  Transportation Needs: Not on file  Physical Activity: Not on file  Stress: Not on file  Social Connections: Not on file  Intimate Partner Violence: Not on file    ROS Review of Systems  All other systems reviewed and are negative.  Objective:   Today's Vitals: BP 131/89   Pulse 69   Temp 98 F (36.7 C) (Oral)   Resp 16   Wt 222 lb 6.4 oz (100.9 kg)   SpO2 94%   BMI 28.55 kg/m   Physical  Exam Vitals and nursing note reviewed.  Constitutional:      General: He is not in acute distress. Cardiovascular:     Rate and Rhythm: Normal rate and regular rhythm.  Pulmonary:     Effort: Pulmonary effort is normal.     Breath sounds: Normal breath sounds.  Abdominal:     Palpations: Abdomen is soft.     Tenderness: There is no abdominal tenderness.  Musculoskeletal:     Right lower leg: No edema.     Left lower leg: No edema.  Neurological:     General: No focal deficit present.     Mental Status: He is alert and oriented to person, place, and time.    Assessment & Plan:   1. Essential hypertension Slightly elevated reading. Meds refilled. Continue present management.  - losartan (COZAAR) 25 MG tablet; Take 1 tablet (25 mg total) by mouth daily.  Dispense: 90 tablet; Refill: 1 - spironolactone (ALDACTONE) 50 MG tablet; Take 1 tablet (50 mg total) by mouth daily.  Dispense: 90 tablet; Refill: 1 - amLODipine (NORVASC) 10 MG tablet;  Take 1 tablet (10 mg total) by mouth daily.  Dispense: 90 tablet; Refill: 1 - Lipid Panel - CMP14+EGFR  2. Encounter for screening for HIV  - HIV antibody (with reflex)    Outpatient Encounter Medications as of 04/01/2021  Medication Sig   [DISCONTINUED] amLODipine (NORVASC) 10 MG tablet TAKE 1 TABLET BY MOUTH EVERY DAY   [DISCONTINUED] losartan (COZAAR) 25 MG tablet Take 1 tablet (25 mg total) by mouth daily.   [DISCONTINUED] spironolactone (ALDACTONE) 50 MG tablet Take 1 tablet (50 mg total) by mouth daily.   amLODipine (NORVASC) 10 MG tablet Take 1 tablet (10 mg total) by mouth daily.   losartan (COZAAR) 25 MG tablet Take 1 tablet (25 mg total) by mouth daily.   spironolactone (ALDACTONE) 50 MG tablet Take 1 tablet (50 mg total) by mouth daily.   No facility-administered encounter medications on file as of 04/01/2021.    Follow-up: Return in about 6 months (around 09/29/2021) for follow up.   Becky Sax, MD

## 2021-04-01 NOTE — Progress Notes (Signed)
Patient is here for HIV testing

## 2021-04-02 LAB — LIPID PANEL
Chol/HDL Ratio: 5.6 ratio — ABNORMAL HIGH (ref 0.0–5.0)
Cholesterol, Total: 168 mg/dL (ref 100–199)
HDL: 30 mg/dL — ABNORMAL LOW (ref >39)
LDL Chol Calc (NIH): 72 mg/dL (ref 0–99)
Triglycerides: 415 mg/dL — ABNORMAL HIGH (ref 0–149)
VLDL Cholesterol Cal: 66 mg/dL — ABNORMAL HIGH (ref 5–40)

## 2021-04-02 LAB — CMP14+EGFR
ALT: 35 IU/L (ref 0–44)
AST: 25 IU/L (ref 0–40)
Albumin/Globulin Ratio: 1.8 (ref 1.2–2.2)
Albumin: 4.8 g/dL (ref 4.0–5.0)
Alkaline Phosphatase: 103 IU/L (ref 44–121)
BUN/Creatinine Ratio: 17 (ref 9–20)
BUN: 15 mg/dL (ref 6–20)
Bilirubin Total: 0.4 mg/dL (ref 0.0–1.2)
CO2: 23 mmol/L (ref 20–29)
Calcium: 9.3 mg/dL (ref 8.7–10.2)
Chloride: 99 mmol/L (ref 96–106)
Creatinine, Ser: 0.89 mg/dL (ref 0.76–1.27)
Globulin, Total: 2.6 g/dL (ref 1.5–4.5)
Glucose: 114 mg/dL — ABNORMAL HIGH (ref 70–99)
Potassium: 3.6 mmol/L (ref 3.5–5.2)
Sodium: 137 mmol/L (ref 134–144)
Total Protein: 7.4 g/dL (ref 6.0–8.5)
eGFR: 115 mL/min/{1.73_m2} (ref >59)

## 2021-04-02 LAB — HIV ANTIBODY (ROUTINE TESTING W REFLEX): HIV Screen 4th Generation wRfx: NONREACTIVE

## 2021-04-03 NOTE — Progress Notes (Signed)
Lab results viewed through my chart

## 2021-04-07 ENCOUNTER — Ambulatory Visit: Payer: Self-pay

## 2021-04-07 ENCOUNTER — Other Ambulatory Visit: Payer: Self-pay

## 2021-04-07 ENCOUNTER — Other Ambulatory Visit (HOSPITAL_COMMUNITY)
Admission: RE | Admit: 2021-04-07 | Discharge: 2021-04-07 | Disposition: A | Discharge status: Home / Self Care | Payer: Self-pay | Source: Ambulatory Visit | Attending: Family Medicine | Admitting: Family Medicine

## 2021-04-07 DIAGNOSIS — Z113 Encounter for screening for infections with a predominantly sexual mode of transmission: Secondary | ICD-10-CM | POA: Insufficient documentation

## 2021-04-08 LAB — URINE CYTOLOGY ANCILLARY ONLY
Chlamydia: NEGATIVE
Comment: NEGATIVE
Comment: NEGATIVE
Comment: NORMAL
Neisseria Gonorrhea: NEGATIVE
Trichomonas: NEGATIVE

## 2021-08-25 ENCOUNTER — Telehealth: Payer: Self-pay | Admitting: Physician Assistant

## 2021-08-25 ENCOUNTER — Ambulatory Visit: Payer: Self-pay | Admitting: *Deleted

## 2021-08-25 DIAGNOSIS — R21 Rash and other nonspecific skin eruption: Secondary | ICD-10-CM

## 2021-08-25 DIAGNOSIS — W540XXA Bitten by dog, initial encounter: Secondary | ICD-10-CM

## 2021-08-25 MED ORDER — TETANUS-DIPHTHERIA TOXOIDS TD 5-2 LFU IM INJ: 0.5000 mL | INJECTION | Freq: Once | INTRAMUSCULAR | 0 refills | Status: AC

## 2021-08-25 MED ORDER — AMOXICILLIN-POT CLAVULANATE 875-125 MG PO TABS: 1.0000 | ORAL_TABLET | Freq: Two times a day (BID) | ORAL | 0 refills | Status: DC

## 2021-08-25 NOTE — Progress Notes (Signed)
?Virtual Visit Consent  ? ?Chauncey Reading [place], you are scheduled for a virtual visit with a [facility] provider today.   ?  ?Just as with appointments in the office, your consent must be obtained to participate.  Your consent will be active for this visit and any virtual visit you may have with one of our providers in the next 365 days.   ?  ?If you have a MyChart account, a copy of this consent can be sent to you electronically.  All virtual visits are billed to your insurance company just like a traditional visit in the office.   ? ?As this is a virtual visit, video technology does not allow for your provider to perform a traditional examination.  This may limit your provider's ability to fully assess your condition.  If your provider identifies any concerns that need to be evaluated in person or the need to arrange testing (such as labs, EKG, etc.), we will make arrangements to do so.   ?  ?Although advances in technology are sophisticated, we cannot ensure that it will always work on either your end or our end.  If the connection with a video visit is poor, the visit may have to be switched to a telephone visit.  With either a video or telephone visit, we are not always able to ensure that we have a secure connection.    ? ?I need to obtain your verbal consent now.   Are you willing to proceed with your visit today?  ?  ?Edelmiro Niemi [place] has provided verbal consent on 08/25/2021 for a virtual visit (video or telephone). ?  ?Mar Daring, PA-C  ? ?Date: 08/25/2021 4:00 PM ? ? ?Virtual Visit via Video Note  ? ?Reynolds Bowl, connected with  Noar Alessandro Wells  ([id number], [birth year]) on 08/25/21 at  3:45 PM EDT by a video-enabled telemedicine application and verified that I am speaking with the correct person using two identifiers. ? ?Location: ?Patient: Virtual Visit Location Patient: Home ?Provider: Virtual Visit Location Provider: Home Office ?  ?I discussed the limitations of evaluation and management  by telemedicine and the availability of in person appointments. The patient expressed understanding and agreed to proceed.   ? ?History of Present Illness: ?Jimmy Ibarra is a 36 y.o. who identifies as a male who was assigned male at birth, and is being seen today for dog bite. ? ?HPI: Animal Bite  ?The incident occurred more than 2 days ago (3 days ago). The incident occurred at home. There is an injury to the Right index finger (at the MCP joint of the right index finger. Larger laceration on the dorsal aspect and a small puncture on the palmar side). The pain is moderate. It is unlikely that a foreign body is present. Pertinent negatives include no chest pain, no numbness, no abdominal pain, no nausea, no vomiting, no inability to bear weight, no focal weakness, no light-headedness, no tingling and no difficulty breathing. There have been no prior injuries to these areas. His tetanus status is out of date. He has been Behaving normally.   ?His own dog bit him on Friday evening, 08/22/21. Dog is up to date on all vaccinations. He is NOT up to date on his tetanus booster.  ? ?Problems:  ?Patient Active Problem List  ? Diagnosis Date Noted  ? Essential hypertension 03/24/2018  ? Retinal hemorrhage , acute  03/24/2018  ? Autism spectrum disorder 03/24/2018  ?  ?Allergies: No Known Allergies ?  Medications:  ?Current Outpatient Medications:  ?  amoxicillin-clavulanate (AUGMENTIN) 875-125 MG tablet, Take 1 tablet by mouth 2 (two) times daily., Disp: 20 tablet, Rfl: 0 ?  tetanus & diphtheria toxoids, adult, (TENIVAC) 5-2 LFU injection, Inject 0.5 mLs into the muscle once for 1 dose., Disp: 0.5 mL, Rfl: 0 ?  amLODipine (NORVASC) 10 MG tablet, Take 1 tablet (10 mg total) by mouth daily., Disp: 90 tablet, Rfl: 1 ?  losartan (COZAAR) 25 MG tablet, Take 1 tablet (25 mg total) by mouth daily., Disp: 90 tablet, Rfl: 1 ?  spironolactone (ALDACTONE) 50 MG tablet, Take 1 tablet (50 mg total) by mouth daily., Disp: 90 tablet, Rfl:  1 ? ?Observations/Objective: ?Patient is well-developed, well-nourished in no acute distress.  ?Resting comfortably at home.  ?Head is normocephalic, atraumatic.  ?No labored breathing.  ?Speech is clear and coherent with logical content.  ?Patient is alert and oriented at baseline.  ?Laceration on the MCP joint of the right 2nd finger with dry eschar covering, redness and swelling noted at the wound and extending into the back of the hand. Small puncture wound at the MCP joint of the 2nd finger on the palmar side as well. Appeared to be in normal stage of healing on palmar side, no signs of infection at that wound. Able to move joint normally without issue. ? ?Assessment and Plan: ?1. Dog bite, initial encounter ?- amoxicillin-clavulanate (AUGMENTIN) 875-125 MG tablet; Take 1 tablet by mouth 2 (two) times daily.  Dispense: 20 tablet; Refill: 0 ?- tetanus & diphtheria toxoids, adult, (TENIVAC) 5-2 LFU injection; Inject 0.5 mLs into the muscle once for 1 dose.  Dispense: 0.5 mL; Refill: 0 ? ?- Augmentin prescribed for dog bite to cover for any bacterial infection ?- Not up to date on Td booster, sent in Rx if patient desires ?- Continue ice ?- Tylenol and Ibuprofen as needed ?- Seek in person evaluation if worsening or fails to improve ? ?Follow Up Instructions: ?I discussed the assessment and treatment plan with the patient. The patient was provided an opportunity to ask questions and all were answered. The patient agreed with the plan and demonstrated an understanding of the instructions.  A copy of instructions were sent to the patient via MyChart unless otherwise noted below.  ? ? ?The patient was advised to call back or seek an in-person evaluation if the symptoms worsen or if the condition fails to improve as anticipated. ? ?Time:  ?I spent 16 minutes with the patient via telehealth technology discussing the above problems/concerns.   ? ?Mar Daring, PA-C ?

## 2021-08-25 NOTE — Telephone Encounter (Signed)
?  Chief Complaint: dog bite ?Symptoms: just open wound, painful ?Frequency: happened 3 days ago ?Pertinent Negatives: Patient denies fever, redness, swelling ?Disposition: [] ED /[] Urgent Care (no appt availability in office) / [] Appointment(In office/virtual)/ [x]  [facility]/ [] Home Care/ [] Refused Recommended Disposition /[] [facility]/ []  Follow-up with PCP ?Additional Notes: No appt, MyChart visit scheduled via [facility] Virtual visits. ? ? ?Reason for Disposition ? [1] Puncture wound or small cut AND [2] on hands or genitals (Exception: puncture  from small pet such as gerbil, mouse, hamster, puppy or turtle) ? ?Answer Assessment - Initial Assessment Questions ?1. ANIMAL: "What type of animal caused the bite?" "Is the injury from a bite or a claw?" If the animal is a dog or a cat, ask: "Was it a pet or a stray?" "Was it acting ill or behaving strangely?" ?    The dog is his pet ?2. LOCATION: "Where is the bite located?"  ?    finger ?3. SIZE: "How big is the bite?" "What does it look like?"  ?    1/2" ?4. ONSET: "When did the bite happen?" (Minutes or hours ago)  ?    Friday evening ?5. CIRCUMSTANCES: "Tell me how this happened."  ?    His own dog bit him ?6. TETANUS: "When was the last tetanus booster?" ?    no ?7. PREGNANCY: "Is there any chance you are pregnant?" "When was your last menstrual period?" ?    na ? ?Protocols used: Animal Bite-A-AH ? ?

## 2021-08-25 NOTE — Patient Instructions (Signed)
?Jimmy Ibarra, thank you for joining Mar Daring, PA-C for today's virtual visit.  While this provider is not your primary care provider (PCP), if your PCP is located in our provider database this encounter information will be shared with them immediately following your visit. ? ?Consent: ?(Patient) Jimmy Ibarra provided verbal consent for this virtual visit at the beginning of the encounter. ? ?Current Medications: ? ?Current Outpatient Medications:  ?  amoxicillin-clavulanate (AUGMENTIN) 875-125 MG tablet, Take 1 tablet by mouth 2 (two) times daily., Disp: 20 tablet, Rfl: 0 ?  tetanus & diphtheria toxoids, adult, (TENIVAC) 5-2 LFU injection, Inject 0.5 mLs into the muscle once for 1 dose., Disp: 0.5 mL, Rfl: 0 ?  amLODipine (NORVASC) 10 MG tablet, Take 1 tablet (10 mg total) by mouth daily., Disp: 90 tablet, Rfl: 1 ?  losartan (COZAAR) 25 MG tablet, Take 1 tablet (25 mg total) by mouth daily., Disp: 90 tablet, Rfl: 1 ?  spironolactone (ALDACTONE) 50 MG tablet, Take 1 tablet (50 mg total) by mouth daily., Disp: 90 tablet, Rfl: 1  ? ?Medications ordered in this encounter:  ?Meds ordered this encounter  ?Medications  ? amoxicillin-clavulanate (AUGMENTIN) 875-125 MG tablet  ?  Sig: Take 1 tablet by mouth 2 (two) times daily.  ?  Dispense:  20 tablet  ?  Refill:  0  ?  Order Specific Question:   Supervising Provider  ?  Answer:   Noemi Chapel [[id number]]  ? tetanus & diphtheria toxoids, adult, (TENIVAC) 5-2 LFU injection  ?  Sig: Inject 0.5 mLs into the muscle once for 1 dose.  ?  Dispense:  0.5 mL  ?  Refill:  0  ?  Order Specific Question:   Supervising Provider  ?  Answer:   Noemi Chapel [[id number]]  ?  ? ?*If you need refills on other medications prior to your next appointment, please contact your pharmacy* ? ?Follow-Up: ?Call back or seek an in-person evaluation if the symptoms worsen or if the condition fails to improve as anticipated. ? ?Other Instructions ? ?Animal Bite, Adult ?Animal bites range from  mild to serious. An animal bite can result in any of these injuries: ?A scratch. ?A deep, open cut. ?A puncture of the skin. ?A crush injury. ?Tearing away of the skin or a body part. ?A bone injury. ?A small bite from a house pet is usually less serious than a bite from a stray or wild animal, such as a raccoon, fox, skunk, or bat. That is because stray and wild animals have a higher risk of carrying a serious infection called rabies, which can be passed to humans through a bite. ?What increases the risk? ?You are more likely to be bitten by an animal if: ?You are around unfamiliar pets. ?You disturb an animal when it is eating, sleeping, or caring for its babies. ?You are outdoors in a place where small, wild animals roam freely. ?What are the signs or symptoms? ?Common symptoms of an animal bite include: ?Pain. ?Bleeding. ?Swelling. ?Bruising. ?How is this diagnosed? ?This condition may be diagnosed based on a physical exam and medical history. Your health care provider will examine your wound and ask for details about the animal and how the bite happened. You may also have tests, such as: ?Blood tests to check for infection. ?X-rays to check for damage to bones or joints. ?Taking a fluid sample from your wound and checking it for infection (culture test). ?How is this treated? ?Treatment varies depending on the  type of animal, where the bite is on your body, and your medical history. Treatment may include: ?Caring for the wound. This often includes cleaning the wound, rinsing out (flushing) the wound with saline solution, and applying a bandage (dressing). In some cases, the wound may be closed with stitches (sutures), staples, skin glue, or adhesive strips. ?Antibiotic medicine to prevent or treat infection. This medicine may be prescribed in pill or ointment form. If the bite area becomes infected, the medicine may be given through an IV. ?A tetanus shot to prevent tetanus infection. ?Rabies treatment to  prevent rabies infection. This will be done if the animal could have rabies. ?Surgery. This may be done if a bite gets infected or if there is damage that needs to be repaired. ?Follow these instructions at home: ?Wound care ? ?Follow instructions from your health care provider about how to take care of your wound. Make sure you: ?Wash your hands with soap and water before you change your dressing. If soap and water are not available, use hand sanitizer. ?Change your dressing as told by your health care provider. ?Leave sutures, skin glue, or adhesive strips in place. These skin closures may need to stay in place for 2 weeks or longer. If adhesive strip edges start to loosen and curl up, you may trim the loose edges. Do not remove adhesive strips completely unless your health care provider tells you to do that. ?Check your wound every day for signs of infection. Check for: ?More redness, swelling, or pain. ?More fluid or blood. ?Warmth. ?Pus or a bad smell. ?Medicines ?Take or apply over-the-counter and prescription medicines only as told by your health care provider. ?If you were prescribed an antibiotic, take or apply it as told by your health care provider. Do not stop using the antibiotic even if your condition improves. ?General instructions ? ?Keep the injured area raised (elevated) above the level of your heart while you are sitting or lying down, if this is possible. ?If directed, put ice on the injured area. ?Put ice in a plastic bag. ?Place a towel between your skin and the bag. ?Leave the ice on for 20 minutes, 2-3 times per day. ?Keep all follow-up visits as told by your health care provider. This is important. ?Contact a health care provider if: ?You have more redness, swelling, or pain around your wound. ?Your wound feels warm to the touch. ?You have a fever or chills. ?You have a general feeling of sickness (malaise). ?You feel nauseous or you vomit. ?You have pain that does not get better. ?Get help  right away if: ?You have a red streak that leads away from your wound. ?You have non-clear fluid or more blood coming from your wound. ?There is pus or a bad smell coming from your wound. ?You have trouble moving your injured area. ?You have numbness or tingling that extends beyond the wound. ?Summary ?Animal bites can range from mild to serious. An animal bite can cause a scratch on the skin, a deep open cut, a puncture of the skin, a crush injury, tearing away of the skin or a body part, or a bone injury. ?Your health care provider will examine your wound and ask for details about the animal and how the bite happened. ?You may also have tests such as a blood test, X-ray, or testing of a fluid sample from your wound (culture test). ?Treatment may include wound care, antibiotic medicine, a tetanus shot, and rabies treatment if the animal could have  rabies. ?This information is not intended to replace advice given to you by your health care provider. Make sure you discuss any questions you have with your health care provider. ?Document Revised: 03/12/2020 Document Reviewed: 03/12/2020 ?Elsevier Patient Education ? 2022 [place]. ? ? ? ?If you have been instructed to have an in-person evaluation today at a local Urgent Care facility, please use the link below. It will take you to a list of all of our available [facility], including address, phone number and hours of operation. Please do not delay care.  ?[facility] ? ?If you or a family member do not have a primary care provider, use the link below to schedule a visit and establish care. When you choose a [facility] primary care physician or advanced practice provider, you gain a long-term partner in health. ?Find a Primary Care Provider ? ?Learn more about [facility]'s in-office and virtual care options: ?[facility] ?

## 2021-09-30 ENCOUNTER — Encounter: Payer: Self-pay | Admitting: Family Medicine

## 2021-10-01 ENCOUNTER — Ambulatory Visit (INDEPENDENT_AMBULATORY_CARE_PROVIDER_SITE_OTHER): Payer: Self-pay | Admitting: Family Medicine

## 2021-10-01 ENCOUNTER — Encounter: Payer: Self-pay | Admitting: Family Medicine

## 2021-10-01 DIAGNOSIS — I10 Essential (primary) hypertension: Secondary | ICD-10-CM

## 2021-10-01 DIAGNOSIS — R059 Cough, unspecified: Secondary | ICD-10-CM

## 2021-10-01 NOTE — Progress Notes (Signed)
Patient is here for 6 month follow-up. Patient said that he has no new concerns for provider today. ?

## 2021-10-01 NOTE — Progress Notes (Signed)
Patient rescheduled to virtual 2/2 no electricity in office ?

## 2021-10-01 NOTE — Progress Notes (Signed)
Virtual Visit via Telephone Note ? ?I connected with Jimmy Ibarra on 10/01/21 at 10:40 AM EDT by telephone and verified that I am speaking with the correct person using two identifiers. ? ?Location: ?Patient: at home ?Provider: office ?  ?I discussed the limitations, risks, security and privacy concerns of performing an evaluation and management service by telephone and the availability of in person appointments. I also discussed with the patient that there may be a patient responsible charge related to this service. The patient expressed understanding and agreed to proceed. ? ? ?History of Present Illness:  ?Routine follow up of hypertension. Patient also reports a cough most prominent at night. Denies fever/chills or viral sx.  This visit was switched to a virtual as electricity went out in office on when patient showed up for face-to face  visit.  ?  ?Observations/Objective: ? ? ?Assessment and Plan: ?1. Essential hypertension ?Appears stable with present management continue ? ?2. Cough, unspecified type ?Patient reports improved. Patient defers any med management at this time. ? ? ?Follow Up Instructions: ? ?  ?I discussed the assessment and treatment plan with the patient. The patient was provided an opportunity to ask questions and all were answered. The patient agreed with the plan and demonstrated an understanding of the instructions. ?  ?The patient was advised to call back or seek an in-person evaluation if the symptoms worsen or if the condition fails to improve as anticipated. ? ?I provided 12 minutes of non-face-to-face time during this encounter. ? ? ?Jimmy Raymond, MD  ? ?

## 2021-10-20 ENCOUNTER — Other Ambulatory Visit: Payer: Self-pay | Admitting: Family Medicine

## 2021-10-20 DIAGNOSIS — I10 Essential (primary) hypertension: Secondary | ICD-10-CM

## 2022-02-26 ENCOUNTER — Other Ambulatory Visit: Payer: Self-pay | Admitting: Family Medicine

## 2022-02-26 DIAGNOSIS — I10 Essential (primary) hypertension: Secondary | ICD-10-CM

## 2022-03-03 ENCOUNTER — Encounter: Payer: Self-pay | Admitting: *Deleted

## 2022-03-03 ENCOUNTER — Ambulatory Visit: Payer: Self-pay | Admitting: *Deleted

## 2022-03-03 NOTE — Progress Notes (Unsigned)
Patient ID: Jimmy Ibarra, male    DOB: 1986-05-06  MRN: [id number]  CC: Cough  Subjective: Jimmy Ibarra is a 36 y.o. male who presents for cough.   His concerns today include:  03/03/2022 per triage RN note: Chief Complaint: dry cough Symptoms: making muscles in abd and chest sore Frequency: 3 days Pertinent Negatives: Patient denies fever Disposition: [] ED /[] Urgent Care (no appt availability in office) / [x] Appointment(In office/virtual)/ []  [facility]/ [] Home Care/ [] Refused Recommended Disposition /[] [facility]/ []  Follow-up with PCP Additional Notes: appt made for tomorrow morning at 10:20. The phone line disconnected, I am trying to reach him.   Reason for Disposition  [1] Patient also has allergy symptoms (e.g., itchy eyes, clear nasal discharge, postnasal drip) AND [2] they are acting up  Answer Assessment - Initial Assessment Questions 1. ONSET: "When did the cough begin?"      Few days 2. SEVERITY: "How bad is the cough today?"      Non productive 3. SPUTUM: "Describe the color of your sputum" (none, dry cough; clear, white, yellow, green)     na 4. HEMOPTYSIS: "Are you coughing up any blood?" If so ask: "How much?" (flecks, streaks, tablespoons, etc.)     no 5. DIFFICULTY BREATHING: "Are you having difficulty breathing?" If Yes, ask: "How bad is it?" (e.g., mild, moderate, severe)    - MILD: No SOB at rest, mild SOB with walking, speaks normally in sentences, can lie down, no retractions, pulse < 100.    - MODERATE: SOB at rest, SOB with minimal exertion and prefers to sit, cannot lie down flat, speaks in phrases, mild retractions, audible wheezing, pulse 100-120.    - SEVERE: Very SOB at rest, speaks in single words, struggling to breathe, sitting hunched forward, retractions, pulse > 120      no 6. FEVER: "Do you have a fever?" If Yes, ask: "What is your temperature, how was it measured, and when did it start?"     no 7. CARDIAC HISTORY:  "Do you have any history of heart disease?" (e.g., heart attack, congestive heart failure)      no 8. LUNG HISTORY: "Do you have any history of lung disease?"  (e.g., pulmonary embolus, asthma, emphysema)     no 9. PE RISK FACTORS: "Do you have a history of blood clots?" (or: recent major surgery, recent prolonged travel, bedridden)     no 10. OTHER SYMPTOMS: "Do you have any other symptoms?" (e.g., runny nose, wheezing, chest pain)       no 11. PREGNANCY: "Is there any chance you are pregnant?" "When was your last menstrual period?"       na 12. TRAVEL: "Have you traveled out of the country in the last month?" (e.g., travel history, exposures)       na  Today's visit 03/04/2022: Appointment 10/01/2021 with , MD regarding cough. Today states dry cough happens only during nighttime awakening him from sleep. Does not happen every night. Denies red flag symptoms. Taking over-the-counter medications with minimal relief. Has several dogs and cats.   Patient Active Problem List   Diagnosis Date Noted   Essential hypertension 03/24/2018   Retinal hemorrhage , acute  03/24/2018     Current Outpatient Medications on File Prior to Visit  Medication Sig Dispense Refill   amLODipine (NORVASC) 10 MG tablet TAKE 1 TABLET BY MOUTH EVERY DAY 90 tablet 0   losartan (COZAAR) 25 MG tablet TAKE 1 TABLET (25 MG  TOTAL) BY MOUTH DAILY. 90 tablet 1   spironolactone (ALDACTONE) 50 MG tablet TAKE 1 TABLET BY MOUTH EVERY DAY 90 tablet 0   No current facility-administered medications on file prior to visit.    No Known Allergies  Social History   Socioeconomic History   Marital status: Single    Spouse name: Not on file   Number of children: Not on file   Years of education: Not on file   Highest education level: Not on file  Occupational History   Not on file  Tobacco Use   Smoking status: Never    Passive exposure: Never   Smokeless tobacco: Never  Substance and Sexual Activity    Alcohol use: Yes    Comment: socially   Drug use: No   Sexual activity: Not on file  Other Topics Concern   Not on file  Social History Narrative   Not on file   Social Determinants of Health   Financial Resource Strain: Not on file  Food Insecurity: Not on file  Transportation Needs: Not on file  Physical Activity: Not on file  Stress: Not on file  Social Connections: Not on file  Intimate Partner Violence: Not on file    Family History  Problem Relation Age of Onset   Hypertension Neg Hx    Cancer Neg Hx    Heart disease Neg Hx    Stroke Neg Hx     Past Surgical History:  Procedure Laterality Date   FRACTURE SURGERY      ROS: Review of Systems Negative except as stated above  PHYSICAL EXAM: BP (!) 146/99 (BP Location: Left Arm, Patient Position: Sitting, Cuff Size: Large)   Pulse 69   Temp 98.3 F (36.8 C)   Resp 16   Wt 216 lb (98 kg)   SpO2 94%   BMI 27.00 kg/m   Physical Exam HENT:     Head: Normocephalic and atraumatic.     Nose: Nose normal.     Mouth/Throat:     Mouth: Mucous membranes are moist.     Pharynx: Oropharynx is clear.  Eyes:     Extraocular Movements: Extraocular movements intact.     Conjunctiva/sclera: Conjunctivae normal.     Pupils: Pupils are equal, round, and reactive to light.  Cardiovascular:     Rate and Rhythm: Normal rate and regular rhythm.     Pulses: Normal pulses.     Heart sounds: Normal heart sounds.  Pulmonary:     Effort: Pulmonary effort is normal.     Breath sounds: Normal breath sounds.  Musculoskeletal:     Cervical back: Normal range of motion and neck supple.  Neurological:     General: No focal deficit present.     Mental Status: He is alert and oriented to person, place, and time.  Psychiatric:        Mood and Affect: Mood normal.        Behavior: Behavior normal.    ASSESSMENT AND PLAN: 1. Chronic cough - Routine screening.  - Cetirizine and Benzonatate capsules as prescribed. Counseled on  medication adherence and adverse effects.  - Discussed with patient recommendation to refer to Pulmonology for further evaluation and management of chronic lingering cough over the past several months. Patient stated he does not have health insurance and does not want to go because he does not want to pay a large bill. Discussed with patient to apply for [facility] financial assistance should he need it in the  future (see #2).  - Follow-up with primary provider in 2 weeks or sooner if needed.  - Rapid Strep A - benzonatate (TESSALON) 100 MG capsule; Take 1 capsule (100 mg total) by mouth 2 (two) times daily as needed for cough.  Dispense: 30 capsule; Refill: 1 - cetirizine (ZYRTEC) 10 MG tablet; Take 1 tablet (10 mg total) by mouth daily.  Dispense: 30 tablet; Refill: 2 - Resp panel by RT-PCR (RSV, Flu A&B, Covid) Anterior Nasal Swab - Culture, Group A Strep  2. Financial difficulties - Offered patient [facility] financial discount/orange card. Discussed patient will need to mail in completed application for processing and schedule appointment with financial counselor. Patient verbalized understanding.    Patient was given the opportunity to ask questions.  Patient verbalized understanding of the plan and was able to repeat key elements of the plan. Patient was given clear instructions to go to Emergency Department or return to medical center if symptoms don't improve, worsen, or new problems develop.The patient verbalized understanding.   Orders Placed This Encounter  Procedures   Resp panel by RT-PCR (RSV, Flu A&B, Covid) Anterior Nasal Swab   Culture, Group A Strep   Rapid Strep A     Requested Prescriptions   Signed Prescriptions Disp Refills   benzonatate (TESSALON) 100 MG capsule 30 capsule 1    Sig: Take 1 capsule (100 mg total) by mouth 2 (two) times daily as needed for cough.   cetirizine (ZYRTEC) 10 MG tablet 30 tablet 2    Sig: Take 1 tablet (10 mg total) by mouth daily.     Return in about 2 weeks (around 03/18/2022) for Follow-Up or next available Georganna Skeans, MD. Give [employer] application. Rema Fendt, NP

## 2022-03-03 NOTE — Telephone Encounter (Signed)
  Chief Complaint: dry cough Symptoms: making muscles in abd and chest sore Frequency: 3 days Pertinent Negatives: Patient denies fever Disposition: [] ED /[] Urgent Care (no appt availability in office) / [x] Appointment(In office/virtual)/ []  [facility]/ [] Home Care/ [] Refused Recommended Disposition /[] [facility]/ []  Follow-up with PCP Additional Notes: appt made for tomorrow morning at 10:20. The phone line disconnected, I am trying to reach him.   Reason for Disposition  [1] Patient also has allergy symptoms (e.g., itchy eyes, clear nasal discharge, postnasal drip) AND [2] they are acting up  Answer Assessment - Initial Assessment Questions 1. ONSET: "When did the cough begin?"      Few days 2. SEVERITY: "How bad is the cough today?"      Non productive 3. SPUTUM: "Describe the color of your sputum" (none, dry cough; clear, white, yellow, green)     na 4. HEMOPTYSIS: "Are you coughing up any blood?" If so ask: "How much?" (flecks, streaks, tablespoons, etc.)     no 5. DIFFICULTY BREATHING: "Are you having difficulty breathing?" If Yes, ask: "How bad is it?" (e.g., mild, moderate, severe)    - MILD: No SOB at rest, mild SOB with walking, speaks normally in sentences, can lie down, no retractions, pulse < 100.    - MODERATE: SOB at rest, SOB with minimal exertion and prefers to sit, cannot lie down flat, speaks in phrases, mild retractions, audible wheezing, pulse 100-120.    - SEVERE: Very SOB at rest, speaks in single words, struggling to breathe, sitting hunched forward, retractions, pulse > 120      no 6. FEVER: "Do you have a fever?" If Yes, ask: "What is your temperature, how was it measured, and when did it start?"     no 7. CARDIAC HISTORY: "Do you have any history of heart disease?" (e.g., heart attack, congestive heart failure)      no 8. LUNG HISTORY: "Do you have any history of lung disease?"  (e.g., pulmonary embolus, asthma, emphysema)     no 9.  PE RISK FACTORS: "Do you have a history of blood clots?" (or: recent major surgery, recent prolonged travel, bedridden)     no 10. OTHER SYMPTOMS: "Do you have any other symptoms?" (e.g., runny nose, wheezing, chest pain)       no 11. PREGNANCY: "Is there any chance you are pregnant?" "When was your last menstrual period?"       na 12. TRAVEL: "Have you traveled out of the country in the last month?" (e.g., travel history, exposures)       na  Protocols used: Cough - Acute Non-Productive-A-AH

## 2022-03-03 NOTE — Telephone Encounter (Signed)
Tried to reach pt to notify of appt. Unable to reach but see he has checked in via MyChart.

## 2022-03-04 ENCOUNTER — Encounter: Payer: Self-pay | Admitting: Family

## 2022-03-04 ENCOUNTER — Ambulatory Visit (INDEPENDENT_AMBULATORY_CARE_PROVIDER_SITE_OTHER): Payer: Self-pay | Admitting: Family

## 2022-03-04 ENCOUNTER — Other Ambulatory Visit (HOSPITAL_COMMUNITY)
Admission: RE | Admit: 2022-03-04 | Discharge: 2022-03-04 | Disposition: A | Discharge status: Home / Self Care | Payer: Self-pay | Attending: Family | Admitting: Family

## 2022-03-04 VITALS — BP 146/99 | HR 69 | Temp 98.3°F | Resp 16 | Wt 216.0 lb

## 2022-03-04 DIAGNOSIS — R053 Chronic cough: Secondary | ICD-10-CM

## 2022-03-04 DIAGNOSIS — Z599 Problem related to housing and economic circumstances, unspecified: Secondary | ICD-10-CM

## 2022-03-04 DIAGNOSIS — Z1152 Encounter for screening for COVID-19: Secondary | ICD-10-CM | POA: Insufficient documentation

## 2022-03-04 DIAGNOSIS — R059 Cough, unspecified: Secondary | ICD-10-CM

## 2022-03-04 LAB — RESP PANEL BY RT-PCR (RSV, FLU A&B, COVID)  RVPGX2
Influenza A by PCR: NEGATIVE
Influenza B by PCR: NEGATIVE
Resp Syncytial Virus by PCR: NEGATIVE
SARS Coronavirus 2 by RT PCR: NEGATIVE

## 2022-03-04 LAB — POCT RAPID STREP A (OFFICE): Rapid Strep A Screen: NEGATIVE

## 2022-03-04 MED ORDER — BENZONATATE 100 MG PO CAPS: 100.0000 mg | ORAL_CAPSULE | Freq: Two times a day (BID) | ORAL | 1 refills | Status: DC | PRN

## 2022-03-04 MED ORDER — CETIRIZINE HCL 10 MG PO TABS: 10.0000 mg | ORAL_TABLET | Freq: Every day | ORAL | 2 refills | Status: DC

## 2022-03-04 NOTE — Progress Notes (Signed)
Pt presents for dry cough -productive at bedtime and causes chest pain

## 2022-03-07 LAB — CULTURE, GROUP A STREP: Strep A Culture: NEGATIVE

## 2022-03-19 ENCOUNTER — Ambulatory Visit (INDEPENDENT_AMBULATORY_CARE_PROVIDER_SITE_OTHER): Payer: Self-pay | Admitting: Family Medicine

## 2022-03-19 ENCOUNTER — Encounter: Payer: Self-pay | Admitting: Family Medicine

## 2022-03-19 VITALS — BP 134/88 | HR 67 | Temp 98.2°F | Resp 16 | Wt 211.2 lb

## 2022-03-19 DIAGNOSIS — R053 Chronic cough: Secondary | ICD-10-CM

## 2022-03-19 NOTE — Progress Notes (Signed)
Established Patient Office Visit  Subjective    Patient ID: Jimmy Ibarra, male    DOB: February 14, 1986  Age: 36 y.o. MRN: [id number]  CC:  Chief Complaint  Patient presents with   Follow-up    HPI Connor Meacham presents to for follow up of chronic cough. He believes that it is improving.    Outpatient Encounter Medications as of 03/19/2022  Medication Sig   amLODipine (NORVASC) 10 MG tablet TAKE 1 TABLET BY MOUTH EVERY DAY   benzonatate (TESSALON) 100 MG capsule Take 1 capsule (100 mg total) by mouth 2 (two) times daily as needed for cough.   cetirizine (ZYRTEC) 10 MG tablet Take 1 tablet (10 mg total) by mouth daily.   losartan (COZAAR) 25 MG tablet TAKE 1 TABLET (25 MG TOTAL) BY MOUTH DAILY.   spironolactone (ALDACTONE) 50 MG tablet TAKE 1 TABLET BY MOUTH EVERY DAY   No facility-administered encounter medications on file as of 03/19/2022.    Past Medical History:  Diagnosis Date   ADHD    Autism spectrum disorder    Depressive disorder    History of Shoulder dislocation    Hypertension    [employer] 02/26/2020    Past Surgical History:  Procedure Laterality Date   FRACTURE SURGERY      Family History  Problem Relation Age of Onset   Hypertension Neg Hx    Cancer Neg Hx    Heart disease Neg Hx    Stroke Neg Hx     Social History   Socioeconomic History   Marital status: Single    Spouse name: Not on file   Number of children: Not on file   Years of education: Not on file   Highest education level: Not on file  Occupational History   Not on file  Tobacco Use   Smoking status: Never    Passive exposure: Never   Smokeless tobacco: Never  Substance and Sexual Activity   Alcohol use: Yes    Comment: socially   Drug use: No   Sexual activity: Not on file  Other Topics Concern   Not on file  Social History Narrative   Not on file   Social Determinants of Health   Financial Resource Strain: Not on file  Food Insecurity: Not on file   Transportation Needs: Not on file  Physical Activity: Not on file  Stress: Not on file  Social Connections: Not on file  Intimate Partner Violence: Not on file    Review of Systems  Constitutional:  Negative for chills and fever.  Respiratory:  Positive for cough. Negative for shortness of breath and wheezing.   All other systems reviewed and are negative.       Objective    BP 134/88   Pulse 67   Temp 98.2 F (36.8 C) (Oral)   Resp 16   Wt 211 lb 3.2 oz (95.8 kg)   SpO2 94%   BMI 26.40 kg/m   Physical Exam Vitals and nursing note reviewed.  Constitutional:      General: He is not in acute distress. Cardiovascular:     Rate and Rhythm: Normal rate and regular rhythm.  Pulmonary:     Effort: Pulmonary effort is normal.     Breath sounds: Normal breath sounds.  Neurological:     General: No focal deficit present.     Mental Status: He is alert and oriented to person, place, and time.         Assessment &  Plan:   1. Chronic cough Patient reports improvement. Patient declined cxr, albuterol inhaler or abx. Patient prefers for conservative management of observation and follow up if sx recur or worsen.     No follow-ups on file.   [name], MD

## 2022-03-31 IMAGING — DX DG SHOULDER 2+V*R*
2 series · 2 of 2 positions shown · non-contrast
Comparison: Portable exam 2782 hours compared to 3226 hours as well
as multiple prior studies

CLINICAL DATA: Post reduction of RIGHT shoulder dislocation

EXAM:
RIGHT SHOULDER - 2+ VIEW

[shoulder ap]
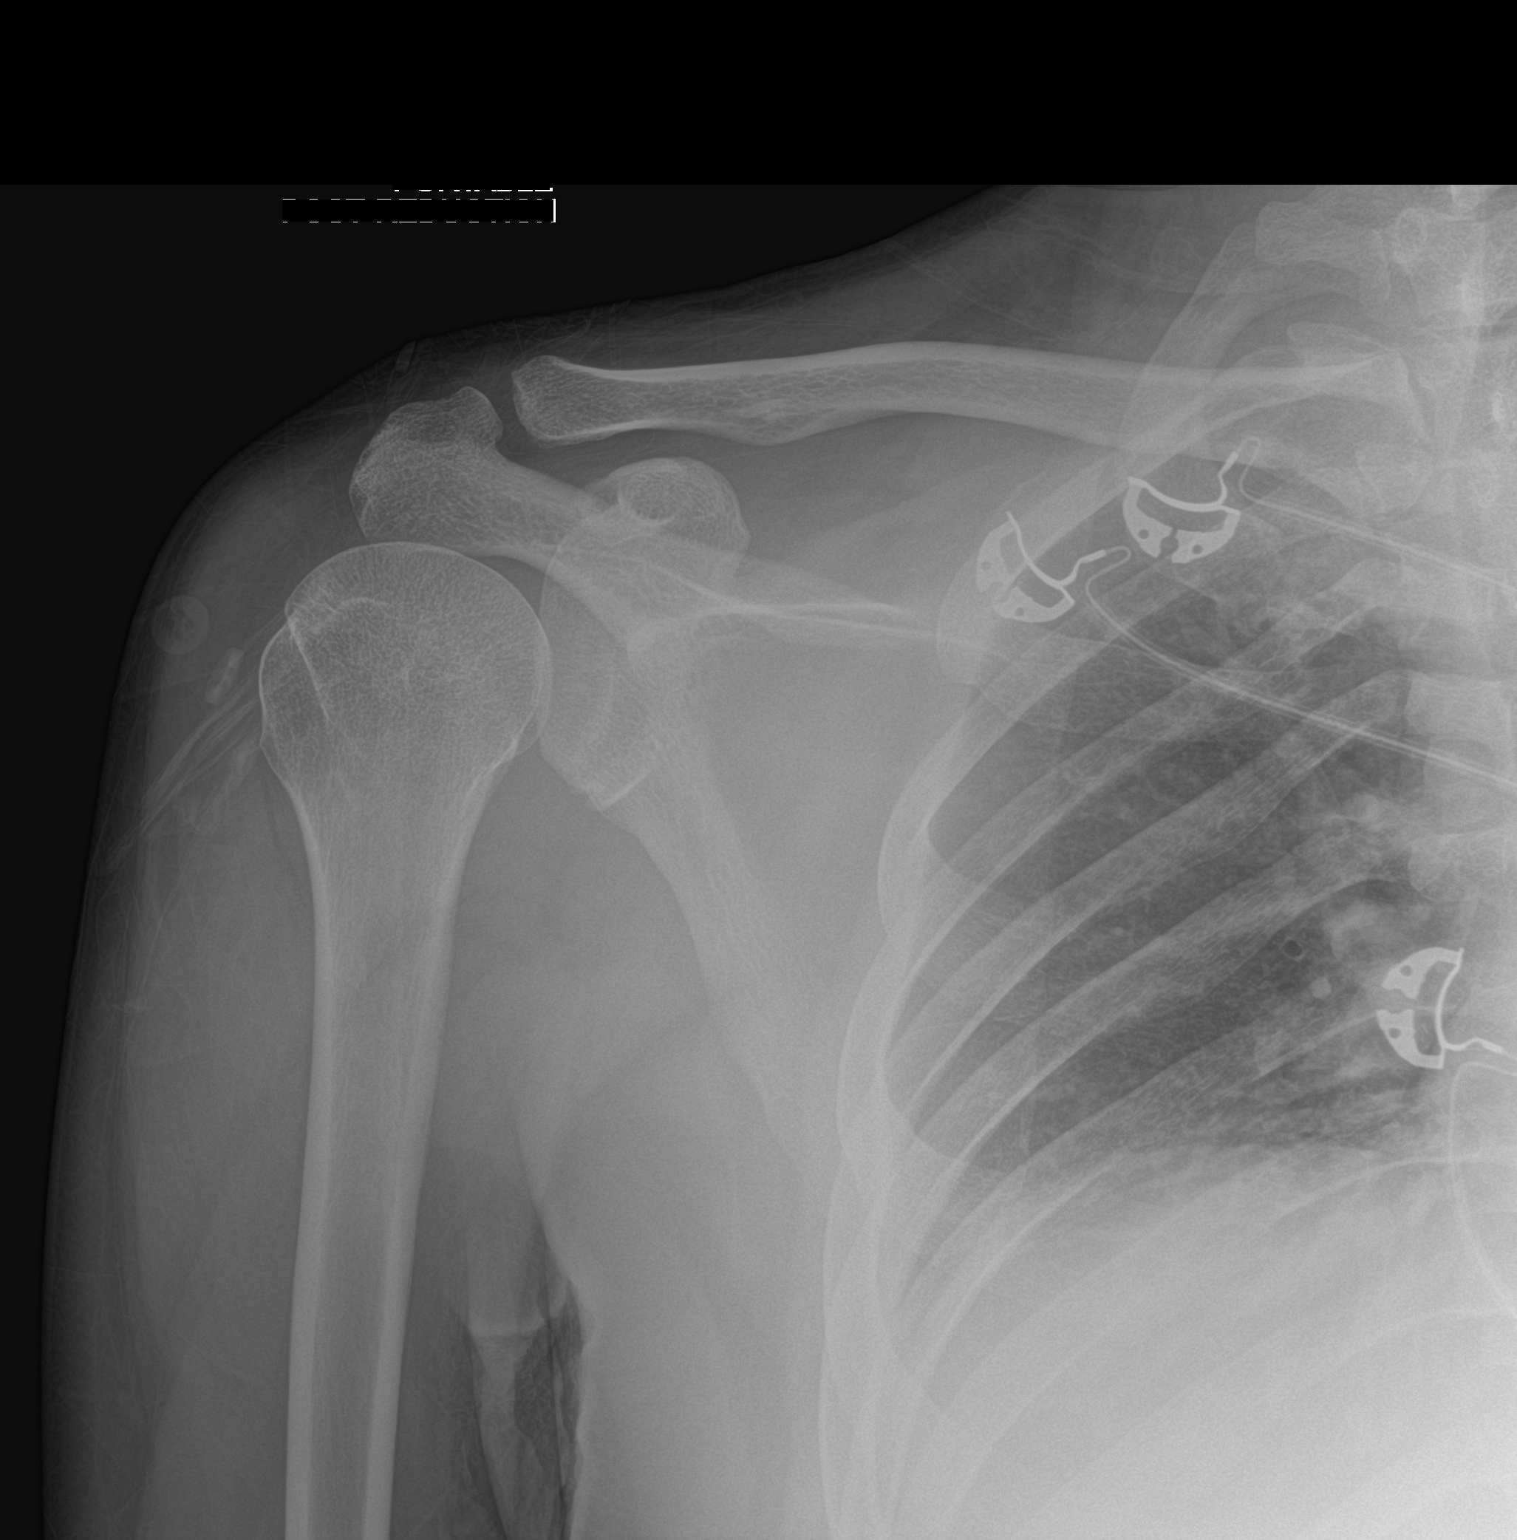

[shoulder obl]
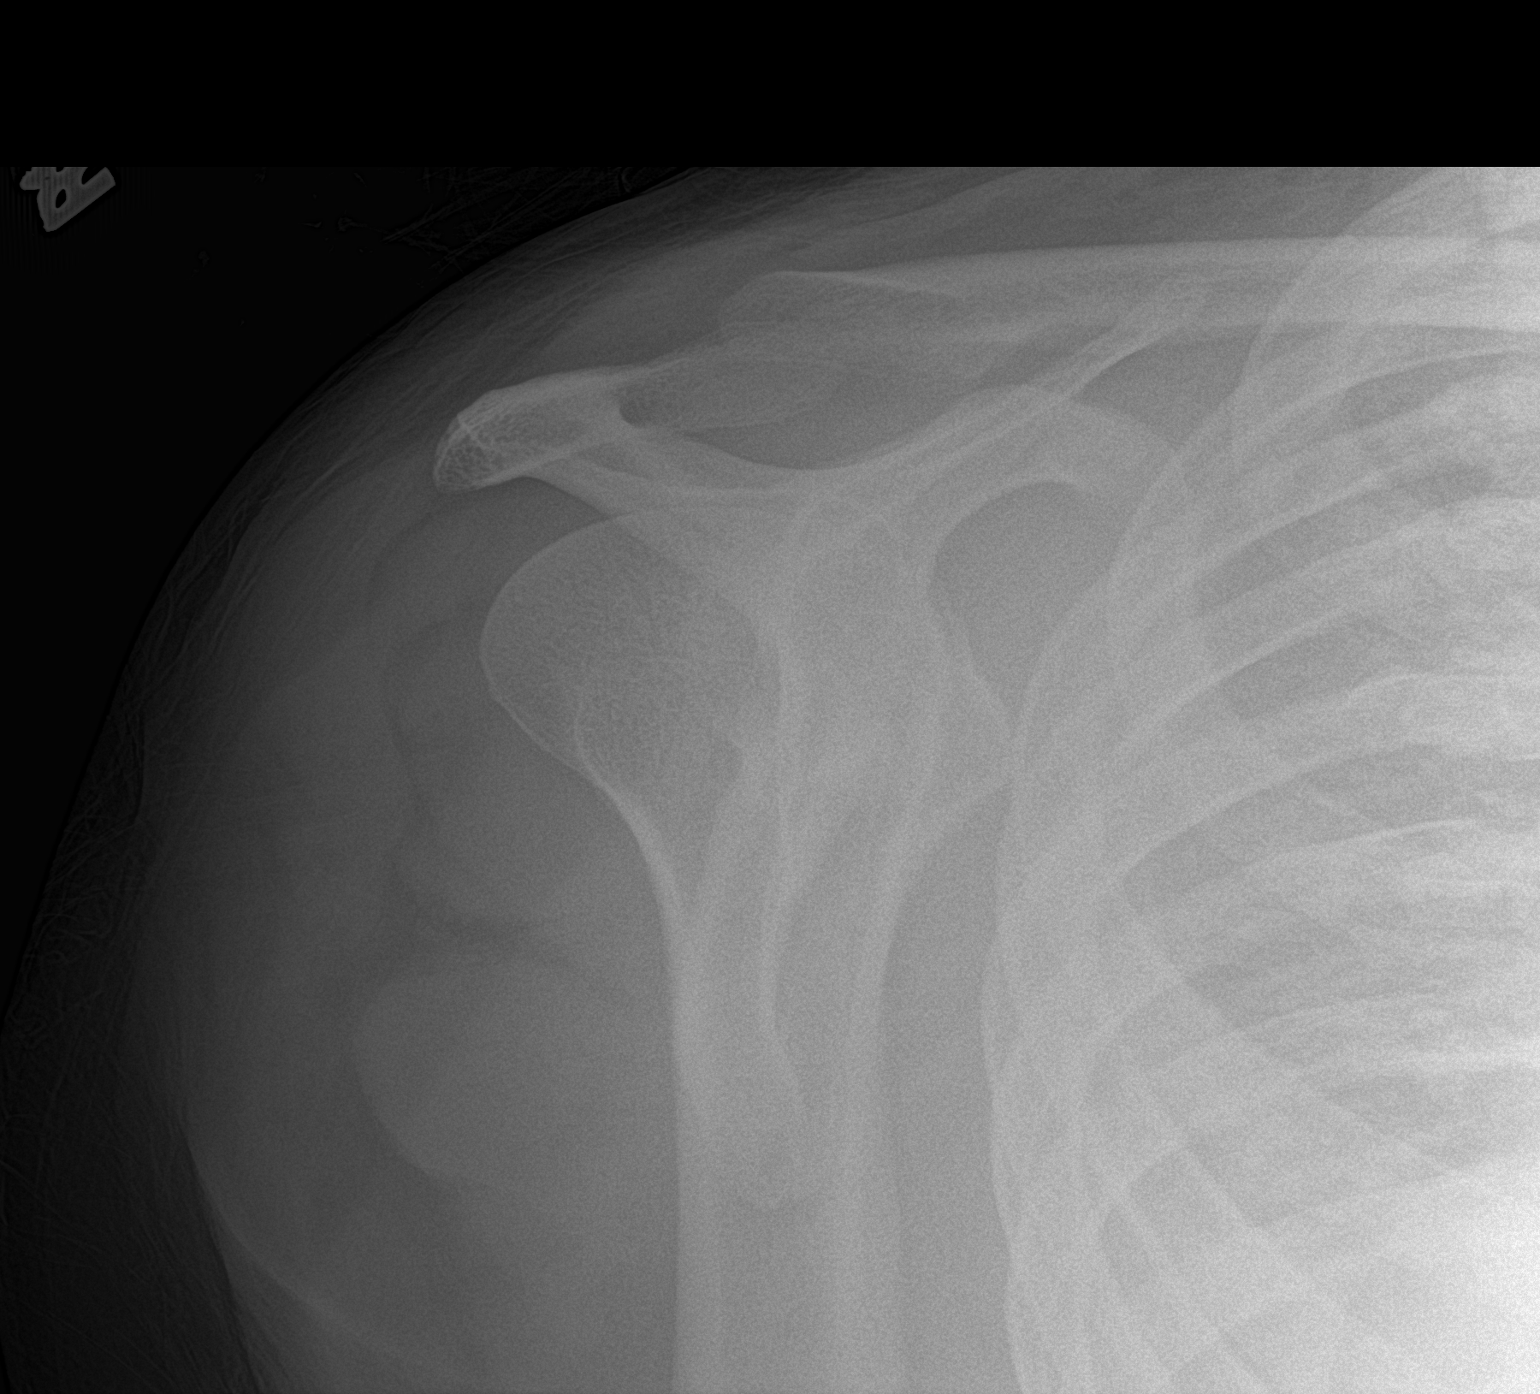

[2 of 2 positions shown; findings below may reference images not displayed]

FINDINGS: Previously identified anterior RIGHT glenohumeral dislocation
appears reduced.

Hill-Sachs impaction deformity of proximal RIGHT humerus, chronic.

No additional fractures identified.

AC joint alignment normal.

Osseous mineralization normal.
IMPRESSION: Reduction of previously identified RIGHT glenohumeral dislocation.

## 2022-07-01 ENCOUNTER — Other Ambulatory Visit: Payer: Self-pay | Admitting: Family Medicine

## 2022-07-01 DIAGNOSIS — I10 Essential (primary) hypertension: Secondary | ICD-10-CM

## 2022-07-05 ENCOUNTER — Other Ambulatory Visit: Payer: Self-pay | Admitting: Family Medicine

## 2022-07-05 DIAGNOSIS — I10 Essential (primary) hypertension: Secondary | ICD-10-CM

## 2022-07-28 ENCOUNTER — Other Ambulatory Visit: Payer: Self-pay | Admitting: Family Medicine

## 2022-07-28 DIAGNOSIS — I10 Essential (primary) hypertension: Secondary | ICD-10-CM

## 2022-08-04 ENCOUNTER — Other Ambulatory Visit: Payer: Self-pay | Admitting: Family Medicine

## 2022-08-04 DIAGNOSIS — I10 Essential (primary) hypertension: Secondary | ICD-10-CM

## 2022-09-03 ENCOUNTER — Emergency Department (HOSPITAL_BASED_OUTPATIENT_CLINIC_OR_DEPARTMENT_OTHER)
Admission: EM | Admit: 2022-09-03 | Discharge: 2022-09-03 | Disposition: A | Discharge status: Home / Self Care | Payer: Self-pay | Attending: Emergency Medicine | Admitting: Emergency Medicine

## 2022-09-03 ENCOUNTER — Other Ambulatory Visit: Payer: Self-pay

## 2022-09-03 ENCOUNTER — Emergency Department (HOSPITAL_BASED_OUTPATIENT_CLINIC_OR_DEPARTMENT_OTHER): Payer: Self-pay | Admitting: Radiology

## 2022-09-03 ENCOUNTER — Encounter (HOSPITAL_BASED_OUTPATIENT_CLINIC_OR_DEPARTMENT_OTHER): Payer: Self-pay | Admitting: *Deleted

## 2022-09-03 DIAGNOSIS — F840 Autistic disorder: Secondary | ICD-10-CM | POA: Insufficient documentation

## 2022-09-03 DIAGNOSIS — Z79899 Other long term (current) drug therapy: Secondary | ICD-10-CM | POA: Insufficient documentation

## 2022-09-03 DIAGNOSIS — S43004A Unspecified dislocation of right shoulder joint, initial encounter: Secondary | ICD-10-CM | POA: Insufficient documentation

## 2022-09-03 DIAGNOSIS — I10 Essential (primary) hypertension: Secondary | ICD-10-CM | POA: Insufficient documentation

## 2022-09-03 DIAGNOSIS — X58XXXA Exposure to other specified factors, initial encounter: Secondary | ICD-10-CM | POA: Insufficient documentation

## 2022-09-03 MED ORDER — MORPHINE SULFATE (PF) 4 MG/ML IV SOLN
4.0000 mg | Freq: Once | INTRAVENOUS | Status: AC
  Administered 2022-09-03: 4 mg via INTRAVENOUS
  Filled 2022-09-03: qty 1

## 2022-09-03 MED ORDER — KETOROLAC TROMETHAMINE 15 MG/ML IJ SOLN
INTRAMUSCULAR | Status: AC
  Administered 2022-09-03: 30 mg
  Filled 2022-09-03: qty 2

## 2022-09-03 MED ORDER — LIDOCAINE HCL 2 % IJ SOLN
20.0000 mL | Freq: Once | INTRAMUSCULAR | Status: AC
  Administered 2022-09-03: 10 mg
  Filled 2022-09-03: qty 20

## 2022-09-03 MED ORDER — PROPOFOL 10 MG/ML IV BOLUS
INTRAVENOUS | Status: AC | PRN
  Administered 2022-09-03: 20 mg via INTRAVENOUS

## 2022-09-03 MED ORDER — PROPOFOL 10 MG/ML IV BOLUS
1.0000 mg/kg | Freq: Once | INTRAVENOUS | Status: AC
  Administered 2022-09-03: 100 mg via INTRAVENOUS

## 2022-09-03 MED ORDER — PROPOFOL 10 MG/ML IV BOLUS
INTRAVENOUS | Status: AC
  Filled 2022-09-03: qty 20

## 2022-09-03 NOTE — Sedation Documentation (Signed)
Dr. Dina Rich performing reduction of right shoulder.

## 2022-09-03 NOTE — Sedation Documentation (Addendum)
Pt given additional dose of propofol.  Reduction by MD in progress. Pt tolerating well.  VSS. Sinus rhythm.

## 2022-09-03 NOTE — ED Provider Notes (Signed)
[place] Provider Note   CSN: [id number] Arrival date & time: 09/03/22  0101     History  Chief Complaint  Patient presents with   Shoulder Injury    Jimmy Ibarra is a 37 y.o. male.  HPI     This is a 37 year old male who presents with right shoulder pain.  Patient has a history of prior shoulder dislocation and feels that he is dislocated his shoulder again while engaging in sexual intercourse.  Denies numbness or tingling of the hand.  He is right-hand dominant.  Has required sedation in the past.  Home Medications Prior to Admission medications   Medication Sig Start Date End Date Taking? Authorizing Provider  amLODipine (NORVASC) 10 MG tablet TAKE 1 TABLET BY MOUTH EVERY DAY 07/01/22   Dorna Mai, MD  losartan (COZAAR) 25 MG tablet TAKE 1 TABLET BY MOUTH DAILY. 08/06/22   Dorna Mai, MD  spironolactone (ALDACTONE) 50 MG tablet TAKE 1 TABLET BY MOUTH EVERY DAY 07/28/22   Dorna Mai, MD      Allergies    Patient has no known allergies.    Review of Systems   Review of Systems  Musculoskeletal:        Shoulder pain  All other systems reviewed and are negative.   Physical Exam Updated Vital Signs BP (!) 123/90   Pulse 81   Temp (!) 97 F (36.1 C) (Oral)   Resp 16   Wt 98.4 kg   SpO2 95%   BMI 27.12 kg/m  Physical Exam Vitals and nursing note reviewed.  Constitutional:      Appearance: He is well-developed. He is not ill-appearing.  HENT:     Head: Normocephalic and atraumatic.  Eyes:     Pupils: Pupils are equal, round, and reactive to light.  Cardiovascular:     Rate and Rhythm: Normal rate and regular rhythm.  Pulmonary:     Effort: Pulmonary effort is normal. No respiratory distress.  Abdominal:     Palpations: Abdomen is soft.  Musculoskeletal:     Cervical back: Neck supple.     Comments: Shoulder noted to be in adducted position, humeral head palpated anteriorly with void glenoid fossa.   2+ radial pulse distally, neurovascularly intact  Lymphadenopathy:     Cervical: No cervical adenopathy.  Skin:    General: Skin is warm and dry.  Neurological:     Mental Status: He is alert and oriented to person, place, and time.  Psychiatric:        Mood and Affect: Mood normal.     ED Results / Procedures / Treatments   Labs (all labs ordered are listed, but only abnormal results are displayed) Labs Reviewed - No data to display  EKG None  Radiology DG Shoulder Right  Result Date: 09/03/2022 CLINICAL DATA:  Right shoulder dislocation, post reduction imaging EXAM: RIGHT SHOULDER - 2+ VIEW COMPARISON:  01/27/2020 FINDINGS: Single view radiograph of the right shoulder demonstrates the humeral head overlying its expected position in relation of the glenoid fossa. Hill-Sachs deformity noted. There is a crescentic, ossific deformity of the anteroinferior glenoid rim which may represent a healed bony Bankart fracture, but is not well assessed on this examination. No acute fracture. Acromioclavicular joint space is preserved. IMPRESSION: 1. Successful reduction of right shoulder dislocation. 2. Hill-Sachs deformity. 3. Possible healed bony Bankart fracture with residual glenoid deformity. If indicated, this could be better assessed with nonemergent CT imaging. Electronically Signed  By: Fidela Salisbury M.D.   On: 09/03/2022 01:52    Procedures Reduction of dislocation  Date/Time: 09/03/2022 1:58 AM  Performed by: Merryl Hacker, MD Authorized by: Merryl Hacker, MD  Consent: Written consent obtained. Patient understanding: patient states understanding of the procedure being performed Time out: Immediately prior to procedure a "time out" was called to verify the correct patient, procedure, equipment, support staff and site/side marked as required. Local anesthesia used: yes Anesthesia: local infiltration  Anesthesia: Local anesthesia used: yes Local Anesthetic: lidocaine 1%  without epinephrine Anesthetic total: 10 mL  Sedation: Patient sedated: yes (See additional note)  Patient tolerance: patient tolerated the procedure well with no immediate complications   .Sedation  Date/Time: 09/03/2022 1:59 AM  Performed by: Merryl Hacker, MD Authorized by: Merryl Hacker, MD   Consent:    Consent obtained:  Written   Consent given by:  Patient   Risks discussed:  Prolonged hypoxia resulting in organ damage and prolonged sedation necessitating reversal   Alternatives discussed:  Anxiolysis and analgesia without sedation Universal protocol:    Immediately prior to procedure, a time out was called: yes   Indications:    Procedure performed:  Dislocation reduction   Procedure necessitating sedation performed by:  Physician performing sedation Pre-sedation assessment:    Time since last food or drink:  N/a   NPO status caution: urgency dictates proceeding with non-ideal NPO status     ASA classification: class 2 - patient with mild systemic disease     Mouth opening:  3 or more finger widths   Thyromental distance:  4 finger widths   Mallampati score:  I - soft palate, uvula, fauces, pillars visible   Neck mobility: normal     Pre-sedation assessments completed and reviewed: airway patency, cardiovascular function, nausea/vomiting, pain level, respiratory function and temperature     Pre-sedation assessment completed:  09/03/2022 1:30 AM Immediate pre-procedure details:    Reassessment: Patient reassessed immediately prior to procedure     Reviewed: vital signs and NPO status     Verified: bag valve mask available, emergency equipment available, intubation equipment available and IV patency confirmed   Procedure details (see MAR for exact dosages):    Preoxygenation:  Nasal cannula   Sedation:  Propofol   Analgesia:  Morphine   Intra-procedure monitoring:  Blood pressure monitoring and continuous capnometry   Intra-procedure events: hypoxia      Intra-procedure events comment:  Placed on 4L [place]   Intra-procedure management:  Supplemental oxygen   Total Provider sedation time (minutes):  15 Post-procedure details:    Post-sedation assessment completed:  09/03/2022 2:01 AM   Attendance: Constant attendance by certified staff until patient recovered     Recovery: Patient returned to pre-procedure baseline     Post-sedation assessments completed and reviewed: airway patency, cardiovascular function, hydration status, mental status, nausea/vomiting and pain level     Patient is stable for discharge or admission: yes     Procedure completion:  Tolerated well, no immediate complications     Medications Ordered in ED Medications  propofol (DIPRIVAN) 10 mg/mL bolus/IV push 98.4 mg (has no administration in time range)  ketorolac (TORADOL) 15 MG/ML injection (30 mg  Given 09/03/22 0115)  morphine (PF) 4 MG/ML injection 4 mg (4 mg Intravenous Given 09/03/22 0121)  lidocaine (XYLOCAINE) 2 % (with pres) injection 400 mg (10 mg Infiltration Given by Other 09/03/22 0115)  propofol (DIPRIVAN) 10 mg/mL bolus/IV push (20 mg Intravenous Given 09/03/22 0135)  ED Course/ Medical Decision Making/ A&P                             Medical Decision Making Amount and/or Complexity of Data Reviewed Radiology: ordered.  Risk Prescription drug management.   This patient presents to the ED for concern of shoulder pain, this involves an extensive number of treatment options, and is a complaint that carries with it a high risk of complications and morbidity.  I considered the following differential and admission for this acute, potentially life threatening condition.  The differential diagnosis includes dislocation, fracture MDM:    This is a 37 year old male who presents with right shoulder pain.  History of dislocation.  He is overall nontoxic and vital signs are largely reassuring.  Clinically has dislocation.  He is neurovascularly intact.  Patient declined  attempts without sedation as he is required sedation in the past.  He was consented for sedation.  Patient sedated and x-ray showed reduction of right shoulder.  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  I reviewed his prior x-rays.  He previously had what appeared to be a Hill-Sachs deformity as well.  Unclear whether this was related to today or previous.  I have discussed this with the patient.  Strongly encouraged orthopedic follow-up given recurrence.  (Labs, imaging, consults)  Labs: I Ordered, and personally interpreted labs.  The pertinent results include: None  Imaging Studies ordered: I ordered imaging studies including postreduction films I independently visualized and interpreted imaging. I agree with the radiologist interpretation  Additional history obtained from friend at bedside.  External records from outside source obtained and reviewed including prior x-rays  Cardiac Monitoring: The patient was maintained on a cardiac monitor.  If on the cardiac monitor, I personally viewed and interpreted the cardiac monitored which showed an underlying rhythm of: Sinus rhythm  Reevaluation: After the interventions noted above, I reevaluated the patient and found that they have :improved  Social Determinants of Health:  lives independently  Disposition: Discharge  Co morbidities that complicate the patient evaluation  Past Medical History:  Diagnosis Date   ADHD    Autism spectrum disorder    Depressive disorder    History of Shoulder dislocation    Hypertension    [employer]reesia 02/26/2020     Medicines Meds ordered this encounter  Medications   ketorolac (TORADOL) 15 MG/ML injection    Laughter, Haley: cabinet override   morphine (PF) 4 MG/ML injection 4 mg   lidocaine (XYLOCAINE) 2 % (with pres) injection 400 mg   propofol (DIPRIVAN) 10 mg/mL bolus/IV push 98.4 mg   propofol (DIPRIVAN) 10 mg/mL bolus/IV push     Laughter, Haley: cabinet override   propofol (DIPRIVAN) 10 mg/mL bolus/IV push    I have reviewed the patients home medicines and have made adjustments as needed  Problem List / ED Course: Problem List Items Addressed This Visit   None Visit Diagnoses     Dislocation of right shoulder joint, initial encounter    -  Primary                   Final Clinical Impression(s) / ED Diagnoses Final diagnoses:  Dislocation of right shoulder joint, initial encounter    Rx / DC Orders ED Discharge Orders     None         Merryl Hacker, MD 09/03/22 [phone]

## 2022-09-03 NOTE — ED Notes (Signed)
Pt ambulated down hallway independently with steady gait.

## 2022-09-03 NOTE — Sedation Documentation (Signed)
Awaiting x-ray to see if right shoulder is in place.

## 2022-09-03 NOTE — Sedation Documentation (Signed)
Unable to rate pain due to procedure.  

## 2022-09-03 NOTE — Progress Notes (Signed)
I was present for the duration of the sedation procedure.  Patient remained stable and comfortable on EtCO2 monitoring nasal cannula throughout the procedure.

## 2022-09-03 NOTE — Discharge Instructions (Signed)
You were seen today for recurrent shoulder dislocation.  It is important for you to follow-up with orthopedics as you will likely have ongoing issues.  Take Tylenol or ibuprofen.  Keep sling in place.

## 2022-09-03 NOTE — ED Triage Notes (Addendum)
Pt arrived with a "right dislocated shoulder" Pt states ems arrived to his house and placed a sling to his right arm. Pt states this has happened "5 times" before. Pt states he was having sex when this occurred. No other injury. States etoh at 7pm. States one drink. Friend at bedside. Right shoulder is dislocated on exam.

## 2022-09-03 NOTE — ED Notes (Signed)
Drank a cup of water. No n/v noted.

## 2022-09-03 NOTE — Sedation Documentation (Signed)
Right shoulder confirmed to be in place by xray. Sling placed to right shoulder.

## 2022-09-30 ENCOUNTER — Other Ambulatory Visit: Payer: Self-pay | Admitting: Family Medicine

## 2022-09-30 DIAGNOSIS — I10 Essential (primary) hypertension: Secondary | ICD-10-CM

## 2022-09-30 NOTE — Telephone Encounter (Signed)
Attempted to call patient to schedule appointment- left message to call office- courtesy #[phone] sent to pharmacy. Requested Prescriptions  Pending Prescriptions Disp Refills   amLODipine (NORVASC) 10 MG tablet [Pharmacy Med Name: AMLODIPINE BESYLATE 10 MG TAB] 90 tablet 0    Sig: TAKE 1 TABLET BY MOUTH EVERY DAY     Cardiovascular: Calcium Channel Blockers 2 Failed - 09/30/2022 12:57 AM      Failed - Last BP in normal range    BP Readings from Last 1 Encounters:  09/03/22 (!) 142/108         Failed - Valid encounter within last 6 months    Recent Outpatient Visits           6 months ago Chronic cough   [place] [facility], MD   7 months ago Chronic cough   [place] [facility] [place], [place]   12 months ago Essential hypertension   [place] [facility], MD   1 year ago Essential hypertension   [place] [facility], MD   2 years ago Essential hypertension   [place] [facility], MD              Passed - Last Heart Rate in normal range    Pulse Readings from Last 1 Encounters:  09/03/22 76

## 2022-10-13 ENCOUNTER — Other Ambulatory Visit: Payer: Self-pay | Admitting: Family Medicine

## 2022-10-13 DIAGNOSIS — I10 Essential (primary) hypertension: Secondary | ICD-10-CM

## 2022-10-21 ENCOUNTER — Encounter: Payer: Self-pay | Admitting: Family Medicine

## 2022-10-21 ENCOUNTER — Ambulatory Visit (INDEPENDENT_AMBULATORY_CARE_PROVIDER_SITE_OTHER): Payer: Self-pay | Admitting: Family Medicine

## 2022-10-21 VITALS — BP 133/87 | HR 60 | Temp 97.7°F | Resp 16 | Wt 220.4 lb

## 2022-10-21 DIAGNOSIS — I10 Essential (primary) hypertension: Secondary | ICD-10-CM

## 2022-10-27 ENCOUNTER — Encounter: Payer: Self-pay | Admitting: Family Medicine

## 2022-10-27 NOTE — Progress Notes (Signed)
Hypertension Patient Office Visit  Subjective    Patient ID: Jimmy Ibarra, male    DOB: 03/08/1986  Age: 37 y.o. MRN: [id number]  CC: No chief complaint on file.   HPI Jimmy Ibarra presents for follow up of hypertension. Patient denies acute complaints.    Outpatient Encounter Medications as of 10/21/2022  Medication Sig   amLODipine (NORVASC) 10 MG tablet TAKE 1 TABLET BY MOUTH EVERY DAY   losartan (COZAAR) 25 MG tablet TAKE 1 TABLET BY MOUTH DAILY.   spironolactone (ALDACTONE) 50 MG tablet TAKE 1 TABLET BY MOUTH EVERY DAY   No facility-administered encounter medications on file as of 10/21/2022.    Past Medical History:  Diagnosis Date   ADHD    Autism spectrum disorder    Depressive disorder    History of Shoulder dislocation    Hypertension    [employer]reesia 02/26/2020    Past Surgical History:  Procedure Laterality Date   FRACTURE SURGERY      Family History  Problem Relation Age of Onset   Hypertension Neg Hx    Cancer Neg Hx    Heart disease Neg Hx    Stroke Neg Hx     Social History   Socioeconomic History   Marital status: Single    Spouse name: Not on file   Number of children: Not on file   Years of education: Not on file   Highest education level: Bachelor's degree (e.g., BA, AB, BS)  Occupational History   Not on file  Tobacco Use   Smoking status: Never    Passive exposure: Never   Smokeless tobacco: Never  Substance and Sexual Activity   Alcohol use: Yes    Comment: socially   Drug use: No   Sexual activity: Not on file  Other Topics Concern   Not on file  Social History Narrative   Not on file   Social Determinants of Health   Financial Resource Strain: Low Risk  (10/18/2022)   Overall Financial Resource Strain (CARDIA)    Difficulty of Paying Living Expenses: Not hard at all  Food Insecurity: No Food Insecurity (10/18/2022)   Hunger Vital Sign    Worried About Running Out of Food in the Last Year: Never true    Ran Out of  Food in the Last Year: Never true  Transportation Needs: No Transportation Needs (10/18/2022)   PRAPARE - Administrator, Civil Service (Medical): No    Lack of Transportation (Non-Medical): No  Physical Activity: Sufficiently Active (10/18/2022)   Exercise Vital Sign    Days of Exercise per Week: 7 days    Minutes of Exercise per Session: 60 min  Stress: No Stress Concern Present (10/18/2022)   [employer] of Occupational Health - Occupational Stress Questionnaire    Feeling of Stress : Not at all  Social Connections: Unknown (10/18/2022)   Social Connection and Isolation Panel [NHANES]    Frequency of Communication with Friends and Family: Patient declined    Frequency of Social Gatherings with Friends and Family: Patient declined    Attends Religious Services: Patient declined    Database administrator or Organizations: No    Attends Engineer, structural: Not on file    Marital Status: Patient declined  Intimate Partner Violence: Not on file    Review of Systems  All other systems reviewed and are negative.       Objective    BP 133/87   Pulse 60  Temp 97.7 F (36.5 C) (Oral)   Resp 16   Wt 220 lb 6.4 oz (100 kg)   SpO2 95%   BMI 27.55 kg/m   Physical Exam Vitals and nursing note reviewed.  Constitutional:      General: He is not in acute distress. Cardiovascular:     Rate and Rhythm: Normal rate and regular rhythm.  Pulmonary:     Effort: Pulmonary effort is normal.     Breath sounds: Normal breath sounds.  Abdominal:     Palpations: Abdomen is soft.     Tenderness: There is no abdominal tenderness.  Neurological:     General: No focal deficit present.     Mental Status: He is alert and oriented to person, place, and time.         Assessment & Plan:   1. Essential hypertension Readings much improved with present management. Continue    Return in about 6 months (around 04/23/2023) for follow up.   [name],  MD

## 2022-11-04 ENCOUNTER — Other Ambulatory Visit: Payer: Self-pay | Admitting: Family Medicine

## 2022-11-04 DIAGNOSIS — I10 Essential (primary) hypertension: Secondary | ICD-10-CM

## 2022-11-04 NOTE — Telephone Encounter (Signed)
Requested medication (s) are due for refill today:   Yes  Requested medication (s) are on the active medication list:   Yes  Future visit scheduled:   Yes 04/23/2023 with Dr. Andrey Campanile   Last ordered: 08/06/2022 #90, 0 refills  Unable to fill per protocol because labs are due.      Requested Prescriptions  Pending Prescriptions Disp Refills   losartan (COZAAR) 25 MG tablet [Pharmacy Med Name: LOSARTAN POTASSIUM 25 MG TAB] 90 tablet 0    Sig: TAKE 1 TABLET BY MOUTH EVERY DAY     Cardiovascular:  Angiotensin Receptor Blockers Failed - 11/04/2022  2:18 AM      Failed - Cr in normal range and within 180 days    Creatinine, Ser  Date Value Ref Range Status  04/01/2021 0.89 0.76 - 1.27 mg/dL Final         Failed - K in normal range and within 180 days    Potassium  Date Value Ref Range Status  04/01/2021 3.6 3.5 - 5.2 mmol/L Final         Passed - Patient is not pregnant      Passed - Last BP in normal range    BP Readings from Last 1 Encounters:  10/21/22 133/87         Passed - Valid encounter within last 6 months    Recent Outpatient Visits           2 weeks ago Essential hypertension   [place] [facility], MD   7 months ago Chronic cough   [place] [facility] at [facility], MD   8 months ago Chronic cough   [place] [facility] [place], [place]   1 year ago Essential hypertension   [place] [facility], MD   1 year ago Essential hypertension   [place] [facility] at [facility], MD       Future Appointments             In 5 months Georganna Skeans, MD [facility] at [facility]

## 2022-12-15 ENCOUNTER — Other Ambulatory Visit: Payer: Self-pay | Admitting: Family Medicine

## 2022-12-15 DIAGNOSIS — I10 Essential (primary) hypertension: Secondary | ICD-10-CM

## 2022-12-15 NOTE — Telephone Encounter (Signed)
Medication Refill - Medication: Losartan 25 mg  Has the patient contacted their pharmacy? Yes.   (Agent: If no, request that the patient contact the pharmacy for the refill. If patient does not wish to contact the pharmacy document the reason why and proceed with request.) (Agent: If yes, when and what did the pharmacy advise?)  Preferred Pharmacy (with phone number or street name): [facility] Has the patient been seen for an appointment in the last year OR does the patient have an upcoming appointment? Yes.    Agent: Please be advised that RX refills may take up to 3 business days. We ask that you follow-up with your pharmacy.

## 2022-12-16 NOTE — Telephone Encounter (Signed)
Requested Prescriptions  Pending Prescriptions Disp Refills   losartan (COZAAR) 25 MG tablet 90 tablet 0    Sig: Take 1 tablet (25 mg total) by mouth daily.     Cardiovascular:  Angiotensin Receptor Blockers Failed - 12/15/2022  1:06 PM      Failed - Cr in normal range and within 180 days    Creatinine, Ser  Date Value Ref Range Status  04/01/2021 0.89 0.76 - 1.27 mg/dL Final         Failed - K in normal range and within 180 days    Potassium  Date Value Ref Range Status  04/01/2021 3.6 3.5 - 5.2 mmol/L Final         Passed - Patient is not pregnant      Passed - Last BP in normal range    BP Readings from Last 1 Encounters:  10/21/22 133/87         Passed - Valid encounter within last 6 months    Recent Outpatient Visits           1 month ago Essential hypertension   [place] [facility] at [facility], MD   9 months ago Chronic cough   [place] [facility], MD   9 months ago Chronic cough   [place] [facility] [place], NP   1 year ago Essential hypertension   [place] [facility], MD   1 year ago Essential hypertension   [place] [facility], MD       Future Appointments             In 4 months Georganna Skeans, MD [facility] at [facility]

## 2022-12-21 ENCOUNTER — Other Ambulatory Visit: Payer: Self-pay | Admitting: Family Medicine

## 2022-12-21 DIAGNOSIS — I10 Essential (primary) hypertension: Secondary | ICD-10-CM

## 2022-12-22 ENCOUNTER — Other Ambulatory Visit: Payer: Self-pay | Admitting: Family Medicine

## 2022-12-22 DIAGNOSIS — I10 Essential (primary) hypertension: Secondary | ICD-10-CM

## 2022-12-22 NOTE — Telephone Encounter (Signed)
Medication Refill - Medication: losartan (COZAAR) 25 MG tablet   Has the patient contacted their pharmacy? Yes.    Preferred Pharmacy (with phone number or street name):  [id number] [place] [place] - [address] AT [address] Phone: [phone]  Fax: [phone]     Has the patient been seen for an appointment in the last year OR does the patient have an upcoming appointment? Yes.    Agent: Please be advised that RX refills may take up to 3 business days. We ask that you follow-up with your pharmacy.

## 2022-12-23 NOTE — Telephone Encounter (Signed)
Refill request is too soon, last refill 11/27/22 for 90 days, duplicate request.  Requested Prescriptions  Pending Prescriptions Disp Refills   losartan (COZAAR) 25 MG tablet 90 tablet 0    Sig: Take 1 tablet (25 mg total) by mouth daily.     Cardiovascular:  Angiotensin Receptor Blockers Failed - 12/22/2022 12:08 PM      Failed - Cr in normal range and within 180 days    Creatinine, Ser  Date Value Ref Range Status  04/01/2021 0.89 0.76 - 1.27 mg/dL Final         Failed - K in normal range and within 180 days    Potassium  Date Value Ref Range Status  04/01/2021 3.6 3.5 - 5.2 mmol/L Final         Passed - Patient is not pregnant      Passed - Last BP in normal range    BP Readings from Last 1 Encounters:  10/21/22 133/87         Passed - Valid encounter within last 6 months    Recent Outpatient Visits           2 months ago Essential hypertension   [place] [facility], MD   9 months ago Chronic cough   [place] [facility], [place]   9 months ago Chronic cough   [place] [facility] [place], [place]   1 year ago Essential hypertension   [place] [facility], MD   1 year ago Essential hypertension   [place] [facility], MD       Future Appointments             In 4 months Georganna Skeans, MD [facility]

## 2023-01-20 ENCOUNTER — Other Ambulatory Visit: Payer: Self-pay | Admitting: Family Medicine

## 2023-01-20 DIAGNOSIS — I10 Essential (primary) hypertension: Secondary | ICD-10-CM

## 2023-03-22 ENCOUNTER — Ambulatory Visit: Payer: Self-pay

## 2023-03-22 ENCOUNTER — Telehealth: Payer: Self-pay | Admitting: Family Medicine

## 2023-03-22 DIAGNOSIS — B9689 Other specified bacterial agents as the cause of diseases classified elsewhere: Secondary | ICD-10-CM

## 2023-03-22 DIAGNOSIS — J208 Acute bronchitis due to other specified organisms: Secondary | ICD-10-CM

## 2023-03-22 MED ORDER — ALBUTEROL SULFATE HFA 108 (90 BASE) MCG/ACT IN AERS
1.0000 | INHALATION_SPRAY | Freq: Four times a day (QID) | RESPIRATORY_TRACT | 0 refills | Status: AC | PRN
Start: 2023-03-22 — End: ?

## 2023-03-22 MED ORDER — AMOXICILLIN-POT CLAVULANATE 875-125 MG PO TABS
1.0000 | ORAL_TABLET | Freq: Two times a day (BID) | ORAL | 0 refills | Status: AC
Start: 2023-03-22 — End: 2023-03-29

## 2023-03-22 MED ORDER — BENZONATATE 100 MG PO CAPS
100.0000 mg | ORAL_CAPSULE | Freq: Three times a day (TID) | ORAL | 0 refills | Status: AC | PRN
Start: 2023-03-22 — End: ?

## 2023-03-22 NOTE — Telephone Encounter (Signed)
  Chief Complaint: Cough Symptoms: Cough, congestion Frequency: 3 weeks Pertinent Negatives: Patient denies fever sob Disposition: [] ED /[] Urgent Care (no appt availability in office) / [] Appointment(In office/virtual)/ [x]  [facility]/ [] Home Care/ [] Refused Recommended Disposition /[] [facility]/ []  Follow-up with PCP Additional Notes: PT has had cough for 3 weeks. Cough is keeping him up at night. Pt also has sinus congestion and runny nose. No appts in office - [facility] scheduled.    Summary: cough going on for three weeks a lot of mucus and congestion.   Pt stated he has a bad cough going on for three weeks a lot of mucus and congestion. Stated cough is keeping him up at night.   No appointments. Seeking clinical advice.     Reason for Disposition  Cough has been present for > 3 weeks  Answer Assessment - Initial Assessment Questions 1. ONSET: "When did the cough begin?"      3 weeks 2. SEVERITY: "How bad is the cough today?"      moderate 3. SPUTUM: "Describe the color of your sputum" (none, dry cough; clear, white, yellow, green)     Tan/yellow/green 4. HEMOPTYSIS: "Are you coughing up any blood?" If so ask: "How much?" (flecks, streaks, tablespoons, etc.)     no 5. DIFFICULTY BREATHING: "Are you having difficulty breathing?" If Yes, ask: "How bad is it?" (e.g., mild, moderate, severe)    - MILD: No SOB at rest, mild SOB with walking, speaks normally in sentences, can lie down, no retractions, pulse < 100.    - MODERATE: SOB at rest, SOB with minimal exertion and prefers to sit, cannot lie down flat, speaks in phrases, mild retractions, audible wheezing, pulse 100-120.    - SEVERE: Very SOB at rest, speaks in single words, struggling to breathe, sitting hunched forward, retractions, pulse > 120      no 6. FEVER: "Do you have a fever?" If Yes, ask: "What is your temperature, how was it measured, and when did it start?"     no 7. CARDIAC HISTORY: "Do  you have any history of heart disease?" (e.g., heart attack, congestive heart failure)      HTN 8. LUNG HISTORY: "Do you have any history of lung disease?"  (e.g., pulmonary embolus, asthma, emphysema)     no 10. OTHER SYMPTOMS: "Do you have any other symptoms?" (e.g., runny nose, wheezing, chest pain)       Runny nose - stuffy nose  Protocols used: Cough - Acute Productive-A-AH

## 2023-03-22 NOTE — Patient Instructions (Addendum)
Jimmy Ibarra, thank you for joining Jimmy Finner, NP for today's virtual visit.  While this provider is not your primary care provider (PCP), if your PCP is located in our provider database this encounter information will be shared with them immediately following your visit.   A [facility] MyChart account gives you access to today's visit and all your visits, tests, and labs performed at [facility] " click here if you don't have a Jimmy Ibarra MyChart account or go to [email or web address]  Consent: (Patient) Jimmy Ibarra provided verbal consent for this virtual visit at the beginning of the encounter.  Current Medications:  Current Outpatient Medications:    albuterol (VENTOLIN HFA) 108 (90 Base) MCG/ACT inhaler, Inhale 1-2 puffs into the lungs every 6 (six) hours as needed for wheezing or shortness of breath (cough)., Disp: 8 g, Rfl: 0   amoxicillin-clavulanate (AUGMENTIN) 875-125 MG tablet, Take 1 tablet by mouth 2 (two) times daily for 7 days., Disp: 14 tablet, Rfl: 0   benzonatate (TESSALON) 100 MG capsule, Take 1 capsule (100 mg total) by mouth 3 (three) times daily as needed for cough., Disp: 30 capsule, Rfl: 0   amLODipine (NORVASC) 10 MG tablet, TAKE 1 TABLET BY MOUTH EVERY DAY, Disp: 90 tablet, Rfl: 0   losartan (COZAAR) 25 MG tablet, TAKE 1 TABLET BY MOUTH EVERY DAY, Disp: 90 tablet, Rfl: 0   spironolactone (ALDACTONE) 50 MG tablet, TAKE 1 TABLET BY MOUTH EVERY DAY, Disp: 90 tablet, Rfl: 0   Medications ordered in this encounter:  Meds ordered this encounter  Medications   amoxicillin-clavulanate (AUGMENTIN) 875-125 MG tablet    Sig: Take 1 tablet by mouth 2 (two) times daily for 7 days.    Dispense:  14 tablet    Refill:  0    Order Specific Question:   Supervising Provider    Answer:   Loreli Dollar   albuterol (VENTOLIN HFA) 108 (90 Base) MCG/ACT inhaler    Sig: Inhale 1-2 puffs into the lungs every 6 (six) hours as needed for wheezing  or shortness of breath (cough).    Dispense:  8 g    Refill:  0    Order Specific Question:   Supervising Provider    Answer:   Merrilee Jansky [[id number]]   benzonatate (TESSALON) 100 MG capsule    Sig: Take 1 capsule (100 mg total) by mouth 3 (three) times daily as needed for cough.    Dispense:  30 capsule    Refill:  0    Order Specific Question:   Supervising Provider    Answer:   Merrilee Jansky [id number]     *If you need refills on other medications prior to your next appointment, please contact your pharmacy*  Follow-Up: Call back or seek an in-person evaluation if the symptoms worsen or if the condition fails to improve as anticipated.  [facility] [phone]  Other Instructions   - Take meds as prescribed - Rest voice - Use a cool mist humidifier especially during the winter months when heat dries out the air. - Use saline nose sprays frequently to help soothe nasal passages if they are drying out. - Stay hydrated by drinking plenty of fluids - Keep thermostat turn down low to prevent drying out which can cause a dry cough. - For any cough or congestion- robitussin DM or Delsym as needed -Mucinex for sputum and Flonase as needed  - For fever or aches or pains-  take tylenol or ibuprofen as directed on bottle             * for fevers greater than 101 orally you may alternate ibuprofen and tylenol every 3 hours.  If you do not improve you will need a follow up visit in person.  If you have been instructed to have an in-person evaluation today at a local Urgent Care facility, please use the link below. It will take you to a list of all of our available [facility], including address, phone number and hours of operation. Please do not delay care.  [facility]  If you or a family member do not have a primary care provider, use the link below to schedule a visit and establish care. When you choose a [facility] primary care  physician or advanced practice provider, you gain a long-term partner in health. Find a Primary Care Provider  Learn more about [facility]'s in-office and virtual care options: [facility] - Get Care Now

## 2023-03-22 NOTE — Telephone Encounter (Signed)
NOTED

## 2023-03-22 NOTE — Progress Notes (Signed)
Virtual Visit Consent   [name], you are scheduled for a virtual visit with a [facility] provider today. Just as with appointments in the office, your consent must be obtained to participate. Your consent will be active for this visit and any virtual visit you may have with one of our providers in the next 365 days. If you have a MyChart account, a copy of this consent can be sent to you electronically.  As this is a virtual visit, video technology does not allow for your provider to perform a traditional examination. This may limit your provider's ability to fully assess your condition. If your provider identifies any concerns that need to be evaluated in person or the need to arrange testing (such as labs, EKG, etc.), we will make arrangements to do so. Although advances in technology are sophisticated, we cannot ensure that it will always work on either your end or our end. If the connection with a video visit is poor, the visit may have to be switched to a telephone visit. With either a video or telephone visit, we are not always able to ensure that we have a secure connection.  By engaging in this virtual visit, you consent to the provision of healthcare and authorize for your insurance to be billed (if applicable) for the services provided during this visit. Depending on your insurance coverage, you may receive a charge related to this service.  I need to obtain your verbal consent now. Are you willing to proceed with your visit today? Dwight Carrell [name] has provided verbal consent on 03/22/2023 for a virtual visit (video or telephone). Freddy Finner, NP  Date: 03/22/2023 11:40 AM  Virtual Visit via Video Note   I, Freddy Finner, connected with  Nafees Steuer Snelling  ([id number], January 07, 1986) on 03/22/23 at 11:45 AM EDT by a video-enabled telemedicine application and verified that I am speaking with the correct person using two identifiers.  Location: Patient: Virtual Visit Location  Patient: Home Provider: Virtual Visit Location Provider: Home Office   I discussed the limitations of evaluation and management by telemedicine and the availability of in person appointments. The patient expressed understanding and agreed to proceed.    History of Present Illness: Jimmy Ibarra is a 37 y.o. who identifies as a male who was assigned male at birth, and is being seen today for cough for 3 weeks.  Onset was 3 weeks ago- keeping him up at night and sneezing Associated symptoms are sinus congestion and runny nose, sputum is tan/yellow/green,  Modifying factors are OTC day and nyquil, cough syrups, cough drops, allergy meds, Claritin, hot water- with honey and lemon Denies chest pain, shortness of breath, fevers, chills  Exposure to sick contacts- unknown COVID test: no Vaccines: none recently    Problems:  Patient Active Problem List   Diagnosis Date Noted   Essential hypertension 03/24/2018   Retinal hemorrhage , acute  03/24/2018    Allergies: No Known Allergies Medications:  Current Outpatient Medications:    amLODipine (NORVASC) 10 MG tablet, TAKE 1 TABLET BY MOUTH EVERY DAY, Disp: 90 tablet, Rfl: 0   losartan (COZAAR) 25 MG tablet, TAKE 1 TABLET BY MOUTH EVERY DAY, Disp: 90 tablet, Rfl: 0   spironolactone (ALDACTONE) 50 MG tablet, TAKE 1 TABLET BY MOUTH EVERY DAY, Disp: 90 tablet, Rfl: 0  Observations/Objective: Patient is well-developed, well-nourished in no acute distress.  Resting comfortably  at home.  Head is normocephalic, atraumatic.  No labored breathing.  Speech  is clear and coherent with logical content.  Patient is alert and oriented at baseline.    Assessment and Plan:  1. Acute bacterial bronchitis  - amoxicillin-clavulanate (AUGMENTIN) 875-125 MG tablet; Take 1 tablet by mouth 2 (two) times daily for 7 days.  Dispense: 14 tablet; Refill: 0 - albuterol (VENTOLIN HFA) 108 (90 Base) MCG/ACT inhaler; Inhale 1-2 puffs into the lungs every 6  (six) hours as needed for wheezing or shortness of breath (cough).  Dispense: 8 g; Refill: 0 - benzonatate (TESSALON) 100 MG capsule; Take 1 capsule (100 mg total) by mouth 3 (three) times daily as needed for cough.  Dispense: 30 capsule; Refill: 0   - Take meds as prescribed - Rest voice - Use a cool mist humidifier especially during the winter months when heat dries out the air. - Use saline nose sprays frequently to help soothe nasal passages if they are drying out. - Stay hydrated by drinking plenty of fluids - Keep thermostat turn down low to prevent drying out which can cause a dry cough. - For any cough or congestion- robitussin DM or Delsym as needed -Mucinex for sputum and Flonase as needed  - For fever or aches or pains- take tylenol or ibuprofen as directed on bottle             * for fevers greater than 101 orally you may alternate ibuprofen and tylenol every 3 hours.  If you do not improve you will need a follow up visit in person.                 Reviewed side effects, risks and benefits of medication.    Patient acknowledged agreement and understanding of the plan.   Past Medical, Surgical, Social History, Allergies, and Medications have been Reviewed.   Follow Up Instructions: I discussed the assessment and treatment plan with the patient. The patient was provided an opportunity to ask questions and all were answered. The patient agreed with the plan and demonstrated an understanding of the instructions.  A copy of instructions were sent to the patient via MyChart unless otherwise noted below.     The patient was advised to call back or seek an in-person evaluation if the symptoms worsen or if the condition fails to improve as anticipated.    Freddy Finner, NP

## 2023-04-19 ENCOUNTER — Other Ambulatory Visit: Payer: Self-pay | Admitting: Family Medicine

## 2023-04-19 DIAGNOSIS — I10 Essential (primary) hypertension: Secondary | ICD-10-CM

## 2023-04-23 ENCOUNTER — Ambulatory Visit (INDEPENDENT_AMBULATORY_CARE_PROVIDER_SITE_OTHER): Payer: Self-pay | Admitting: Family Medicine

## 2023-04-23 ENCOUNTER — Encounter: Payer: Self-pay | Admitting: Family Medicine

## 2023-04-23 ENCOUNTER — Other Ambulatory Visit (HOSPITAL_COMMUNITY)
Admission: RE | Admit: 2023-04-23 | Discharge: 2023-04-23 | Disposition: A | Discharge status: Home / Self Care | Payer: Self-pay | Source: Ambulatory Visit | Attending: Family Medicine | Admitting: Family Medicine

## 2023-04-23 VITALS — BP 199/122 | HR 68 | Temp 98.1°F | Resp 18 | Ht 75.0 in | Wt 205.2 lb

## 2023-04-23 DIAGNOSIS — I10 Essential (primary) hypertension: Secondary | ICD-10-CM

## 2023-04-23 DIAGNOSIS — A64 Unspecified sexually transmitted disease: Secondary | ICD-10-CM

## 2023-04-23 DIAGNOSIS — Z23 Encounter for immunization: Secondary | ICD-10-CM

## 2023-04-23 DIAGNOSIS — Z113 Encounter for screening for infections with a predominantly sexual mode of transmission: Secondary | ICD-10-CM

## 2023-04-23 MED ORDER — LOSARTAN POTASSIUM 25 MG PO TABS: 25.0000 mg | ORAL_TABLET | Freq: Every day | ORAL | 1 refills | Status: DC

## 2023-04-23 MED ORDER — AMLODIPINE BESYLATE 10 MG PO TABS: 10.0000 mg | ORAL_TABLET | Freq: Every day | ORAL | 1 refills | Status: DC

## 2023-04-23 MED ORDER — SPIRONOLACTONE 50 MG PO TABS: 50.0000 mg | ORAL_TABLET | Freq: Every day | ORAL | 1 refills | Status: DC

## 2023-04-26 LAB — URINE CYTOLOGY ANCILLARY ONLY
Chlamydia: NEGATIVE
Comment: NEGATIVE
Comment: NORMAL
Neisseria Gonorrhea: NEGATIVE

## 2023-04-27 ENCOUNTER — Encounter: Payer: Self-pay | Admitting: Family Medicine

## 2023-04-27 NOTE — Progress Notes (Signed)
Established Patient Office Visit  Subjective    Patient ID: Jimmy Ibarra, male    DOB: 09/27/1985  Age: 37 y.o. MRN: [id number]  CC:  Chief Complaint  Patient presents with   Follow-up    6 month    HPI Jimmy Ibarra presents for follow up of hypertension. He also would like a test for STI. He denies GU sx.   Outpatient Encounter Medications as of 04/23/2023  Medication Sig   albuterol (VENTOLIN HFA) 108 (90 Base) MCG/ACT inhaler Inhale 1-2 puffs into the lungs every 6 (six) hours as needed for wheezing or shortness of breath (cough).   benzonatate (TESSALON) 100 MG capsule Take 1 capsule (100 mg total) by mouth 3 (three) times daily as needed for cough.   [DISCONTINUED] amLODipine (NORVASC) 10 MG tablet TAKE 1 TABLET BY MOUTH EVERY DAY   [DISCONTINUED] losartan (COZAAR) 25 MG tablet TAKE 1 TABLET BY MOUTH EVERY DAY   [DISCONTINUED] spironolactone (ALDACTONE) 50 MG tablet TAKE 1 TABLET BY MOUTH EVERY DAY   amLODipine (NORVASC) 10 MG tablet Take 1 tablet (10 mg total) by mouth daily.   losartan (COZAAR) 25 MG tablet Take 1 tablet (25 mg total) by mouth daily.   spironolactone (ALDACTONE) 50 MG tablet Take 1 tablet (50 mg total) by mouth daily.   No facility-administered encounter medications on file as of 04/23/2023.    Past Medical History:  Diagnosis Date   ADHD    Autism spectrum disorder    Depressive disorder    History of Shoulder dislocation    Hypertension    [employer]reesia 02/26/2020    Past Surgical History:  Procedure Laterality Date   FRACTURE SURGERY      Family History  Problem Relation Age of Onset   Hypertension Neg Hx    Cancer Neg Hx    Heart disease Neg Hx    Stroke Neg Hx     Social History   Socioeconomic History   Marital status: Single    Spouse name: Not on file   Number of children: Not on file   Years of education: Not on file   Highest education level: Bachelor's degree (e.g., BA, AB, BS)  Occupational History   Not on file   Tobacco Use   Smoking status: Never    Passive exposure: Never   Smokeless tobacco: Never  Substance and Sexual Activity   Alcohol use: Yes    Comment: socially   Drug use: No   Sexual activity: Not on file  Other Topics Concern   Not on file  Social History Narrative   Not on file   Social Determinants of Health   Financial Resource Strain: Low Risk  (04/19/2023)   Overall Financial Resource Strain (CARDIA)    Difficulty of Paying Living Expenses: Not hard at all  Food Insecurity: No Food Insecurity (04/19/2023)   Hunger Vital Sign    Worried About Running Out of Food in the Last Year: Never true    Ran Out of Food in the Last Year: Never true  Transportation Needs: No Transportation Needs (04/19/2023)   PRAPARE - Administrator, Civil Service (Medical): No    Lack of Transportation (Non-Medical): No  Physical Activity: Sufficiently Active (04/19/2023)   Exercise Vital Sign    Days of Exercise per Week: 5 days    Minutes of Exercise per Session: 60 min  Stress: No Stress Concern Present (04/19/2023)   [employer] of Occupational Health - Occupational Stress Questionnaire  Feeling of Stress : Not at all  Social Connections: Unknown (04/19/2023)   Social Connection and Isolation Panel [NHANES]    Frequency of Communication with Friends and Family: Patient declined    Frequency of Social Gatherings with Friends and Family: Patient declined    Attends Religious Services: Patient declined    Database administrator or Organizations: Patient declined    Attends Banker Meetings: Not on file    Marital Status: Patient declined  Intimate Partner Violence: Not on file    Review of Systems  All other systems reviewed and are negative.       Objective    BP (!) 199/122 (BP Location: Left Arm, Patient Position: Sitting, Cuff Size: Normal)   Pulse 68   Temp 98.1 F (36.7 C) (Oral)   Resp 18   Ht 6\' 3"  (1.905 m)   Wt 205 lb 3.2 oz (93.1  kg)   SpO2 96%   BMI 25.65 kg/m   Physical Exam Vitals and nursing note reviewed.  Constitutional:      General: He is not in acute distress. Cardiovascular:     Rate and Rhythm: Normal rate and regular rhythm.  Pulmonary:     Effort: Pulmonary effort is normal.     Breath sounds: Normal breath sounds.  Abdominal:     Palpations: Abdomen is soft.     Tenderness: There is no abdominal tenderness.  Neurological:     General: No focal deficit present.     Mental Status: He is alert and oriented to person, place, and time.         Assessment & Plan:   Essential hypertension -     amLODIPine Besylate; Take 1 tablet (10 mg total) by mouth daily.  Dispense: 90 tablet; Refill: 1 -     Losartan Potassium; Take 1 tablet (25 mg total) by mouth daily.  Dispense: 90 tablet; Refill: 1 -     Spironolactone; Take 1 tablet (50 mg total) by mouth daily.  Dispense: 90 tablet; Refill: 1  STI (sexually transmitted infection) -     Urine cytology ancillary only  Immunization due -     Tdap vaccine greater than or equal to 7yo IM     Return in about 2 weeks (around 05/07/2023).   [name], MD

## 2023-05-10 ENCOUNTER — Ambulatory Visit (INDEPENDENT_AMBULATORY_CARE_PROVIDER_SITE_OTHER): Payer: Self-pay | Admitting: Family Medicine

## 2023-05-10 ENCOUNTER — Encounter: Payer: Self-pay | Admitting: Family Medicine

## 2023-05-10 VITALS — BP 144/98 | HR 72 | Temp 98.3°F | Resp 18 | Wt 205.6 lb

## 2023-05-10 DIAGNOSIS — I10 Essential (primary) hypertension: Secondary | ICD-10-CM

## 2023-05-10 DIAGNOSIS — B9689 Other specified bacterial agents as the cause of diseases classified elsewhere: Secondary | ICD-10-CM

## 2023-05-10 MED ORDER — LOSARTAN POTASSIUM 50 MG PO TABS: 50.0000 mg | ORAL_TABLET | Freq: Every day | ORAL | 1 refills | Status: DC

## 2023-05-11 ENCOUNTER — Encounter: Payer: Self-pay | Admitting: Family Medicine

## 2023-05-11 NOTE — Progress Notes (Signed)
Established Patient Office Visit  Subjective    Patient ID: Jimmy Ibarra, male    DOB: [birth year]  Age: 37 y.o. MRN: [id number]  CC:  Chief Complaint  Patient presents with   Follow-up    B/p    HPI Zaeden Selders presents for follow up of hypertension. Patient reports taking meds as recommended and denies acute complaints.   Outpatient Encounter Medications as of 05/10/2023  Medication Sig   albuterol (VENTOLIN HFA) 108 (90 Base) MCG/ACT inhaler Inhale 1-2 puffs into the lungs every 6 (six) hours as needed for wheezing or shortness of breath (cough).   amLODipine (NORVASC) 10 MG tablet Take 1 tablet (10 mg total) by mouth daily.   benzonatate (TESSALON) 100 MG capsule Take 1 capsule (100 mg total) by mouth 3 (three) times daily as needed for cough.   losartan (COZAAR) 50 MG tablet Take 1 tablet (50 mg total) by mouth daily.   spironolactone (ALDACTONE) 50 MG tablet Take 1 tablet (50 mg total) by mouth daily.   [DISCONTINUED] losartan (COZAAR) 25 MG tablet Take 1 tablet (25 mg total) by mouth daily.   No facility-administered encounter medications on file as of 05/10/2023.    Past Medical History:  Diagnosis Date   ADHD    Autism spectrum disorder    Depressive disorder    History of Shoulder dislocation    Hypertension    Phreesia 02/26/2020    Past Surgical History:  Procedure Laterality Date   FRACTURE SURGERY      Family History  Problem Relation Age of Onset   Hypertension Neg Hx    Cancer Neg Hx    Heart disease Neg Hx    Stroke Neg Hx     Social History   Socioeconomic History   Marital status: Single    Spouse name: Not on file   Number of children: Not on file   Years of education: Not on file   Highest education level: Bachelor's degree (e.g., BA, AB, BS)  Occupational History   Not on file  Tobacco Use   Smoking status: Never    Passive exposure: Never   Smokeless tobacco: Never  Substance and Sexual Activity   Alcohol use: Yes     Comment: socially   Drug use: No   Sexual activity: Not on file  Other Topics Concern   Not on file  Social History Narrative   Not on file   Social Determinants of Health   Financial Resource Strain: Low Risk  (04/19/2023)   Overall Financial Resource Strain (CARDIA)    Difficulty of Paying Living Expenses: Not hard at all  Food Insecurity: No Food Insecurity (04/19/2023)   Hunger Vital Sign    Worried About Running Out of Food in the Last Year: Never true    Ran Out of Food in the Last Year: Never true  Transportation Needs: No Transportation Needs (04/19/2023)   PRAPARE - Administrator, Civil Service (Medical): No    Lack of Transportation (Non-Medical): No  Physical Activity: Sufficiently Active (04/19/2023)   Exercise Vital Sign    Days of Exercise per Week: 5 days    Minutes of Exercise per Session: 60 min  Stress: No Stress Concern Present (04/19/2023)   [employer] of Occupational Health - Occupational Stress Questionnaire    Feeling of Stress : Not at all  Social Connections: Unknown (04/19/2023)   Social Connection and Isolation Panel [NHANES]    Frequency of Communication with Friends  and Family: Patient declined    Frequency of Social Gatherings with Friends and Family: Patient declined    Attends Religious Services: Patient declined    Database administrator or Organizations: Patient declined    Attends Engineer, structural: Not on file    Marital Status: Patient declined  Intimate Partner Violence: Not on file    Review of Systems  All other systems reviewed and are negative.       Objective    BP (!) 144/98 (BP Location: Right Arm, Patient Position: Sitting, Cuff Size: Normal)   Pulse 72   Temp 98.3 F (36.8 C) (Oral)   Resp 18   Wt 205 lb 9.6 oz (93.3 kg)   SpO2 98%   BMI 25.70 kg/m   Physical Exam Vitals and nursing note reviewed.  Constitutional:      General: He is not in acute distress. Cardiovascular:      Rate and Rhythm: Normal rate and regular rhythm.  Pulmonary:     Effort: Pulmonary effort is normal.     Breath sounds: Normal breath sounds.  Abdominal:     Palpations: Abdomen is soft.     Tenderness: There is no abdominal tenderness.  Neurological:     General: No focal deficit present.     Mental Status: He is alert and oriented to person, place, and time.         Assessment & Plan:   Essential hypertension  Other orders -     Losartan Potassium; Take 1 tablet (50 mg total) by mouth daily.  Dispense: 90 tablet; Refill: 1  Much improved readings but still slightly elevated. Will increase losartan from 25 to 50 mg daily and monitor.   Return in about 4 weeks (around 06/07/2023) for follow up.   [name], MD

## 2023-06-08 ENCOUNTER — Ambulatory Visit: Payer: Self-pay | Admitting: Family Medicine

## 2023-10-01 ENCOUNTER — Encounter: Payer: Self-pay | Admitting: Family Medicine

## 2023-10-08 ENCOUNTER — Encounter: Payer: Self-pay | Admitting: Family Medicine

## 2023-10-08 ENCOUNTER — Ambulatory Visit (INDEPENDENT_AMBULATORY_CARE_PROVIDER_SITE_OTHER): Payer: Self-pay | Admitting: Family Medicine

## 2023-10-08 VITALS — BP 142/97 | HR 65 | Ht 75.0 in | Wt 211.6 lb

## 2023-10-08 DIAGNOSIS — Z130 Encounter for screening for diseases of the blood and blood-forming organs and certain disorders involving the immune mechanism: Secondary | ICD-10-CM | POA: Diagnosis not present

## 2023-10-08 DIAGNOSIS — Z1159 Encounter for screening for other viral diseases: Secondary | ICD-10-CM | POA: Diagnosis not present

## 2023-10-08 DIAGNOSIS — Z0000 Encounter for general adult medical examination without abnormal findings: Secondary | ICD-10-CM

## 2023-10-08 DIAGNOSIS — Z13220 Encounter for screening for lipoid disorders: Secondary | ICD-10-CM

## 2023-10-08 NOTE — Progress Notes (Signed)
 Established Patient Office Visit  Subjective    Patient ID: Jimmy Ibarra, male    DOB: 13-May-1986  Age: 38 y.o. MRN: [id number]  CC:  Chief Complaint  Patient presents with   Annual Exam    HPI [name] presents for routine annual exam. Patient denies acute complaints.   Outpatient Encounter Medications as of 10/08/2023  Medication Sig   albuterol  (VENTOLIN  HFA) 108 (90 Base) MCG/ACT inhaler Inhale 1-2 puffs into the lungs every 6 (six) hours as needed for wheezing or shortness of breath (cough).   amLODipine  (NORVASC ) 10 MG tablet Take 1 tablet (10 mg total) by mouth daily.   benzonatate  (TESSALON ) 100 MG capsule Take 1 capsule (100 mg total) by mouth 3 (three) times daily as needed for cough.   losartan  (COZAAR ) 50 MG tablet Take 1 tablet (50 mg total) by mouth daily.   spironolactone  (ALDACTONE ) 50 MG tablet Take 1 tablet (50 mg total) by mouth daily.   No facility-administered encounter medications on file as of 10/08/2023.    Past Medical History:  Diagnosis Date   ADHD    Autism spectrum disorder    Depressive disorder    History of Shoulder dislocation    Hypertension    [employer] 02/26/2020    Past Surgical History:  Procedure Laterality Date   FRACTURE SURGERY      Family History  Problem Relation Age of Onset   Hypertension Neg Hx    Cancer Neg Hx    Heart disease Neg Hx    Stroke Neg Hx     Social History   Socioeconomic History   Marital status: Single    Spouse name: Not on file   Number of children: Not on file   Years of education: Not on file   Highest education level: Bachelor's degree (e.g., BA, AB, BS)  Occupational History   Not on file  Tobacco Use   Smoking status: Never    Passive exposure: Never   Smokeless tobacco: Never  Substance and Sexual Activity   Alcohol use: Yes    Comment: socially   Drug use: No   Sexual activity: Not on file  Other Topics Concern   Not on file  Social History Narrative   Not on file    Social Drivers of Health   Financial Resource Strain: Low Risk  (10/04/2023)   Overall Financial Resource Strain (CARDIA)    Difficulty of Paying Living Expenses: Not hard at all  Food Insecurity: No Food Insecurity (10/04/2023)   Hunger Vital Sign    Worried About Running Out of Food in the Last Year: Never true    Ran Out of Food in the Last Year: Never true  Transportation Needs: No Transportation Needs (10/04/2023)   PRAPARE - Administrator, Civil Service (Medical): No    Lack of Transportation (Non-Medical): No  Physical Activity: Sufficiently Active (10/04/2023)   Exercise Vital Sign    Days of Exercise per Week: 5 days    Minutes of Exercise per Session: 30 min  Stress: Stress Concern Present (10/04/2023)   [employer] of Occupational Health - Occupational Stress Questionnaire    Feeling of Stress : Rather much  Social Connections: Unknown (10/04/2023)   Social Connection and Isolation Panel [NHANES]    Frequency of Communication with Friends and Family: Patient declined    Frequency of Social Gatherings with Friends and Family: Patient declined    Attends Religious Services: Patient declined    Active  Member of Clubs or Organizations: No    Attends Engineer, structural: Not on file    Marital Status: Never married  Intimate Partner Violence: Not on file    Review of Systems  All other systems reviewed and are negative.       Objective    BP (!) 142/97 (BP Location: Right Arm, Patient Position: Sitting, Cuff Size: Normal)   Pulse 65   Ht 6\' 3"  (1.905 m)   Wt 211 lb 9.6 oz (96 kg)   SpO2 96%   BMI 26.45 kg/m   Physical Exam Vitals and nursing note reviewed.  Constitutional:      General: He is not in acute distress. HENT:     Head: Normocephalic and atraumatic.     Right Ear: Tympanic membrane, ear canal and external ear normal.     Left Ear: Tympanic membrane, ear canal and external ear normal.     Nose: Nose normal.     Mouth/Throat:      Mouth: Mucous membranes are moist.     Pharynx: Oropharynx is clear.  Eyes:     Conjunctiva/sclera: Conjunctivae normal.     Pupils: Pupils are equal, round, and reactive to light.  Neck:     Thyroid : No thyromegaly.  Cardiovascular:     Rate and Rhythm: Normal rate and regular rhythm.     Heart sounds: Normal heart sounds. No murmur heard. Pulmonary:     Effort: Pulmonary effort is normal.     Breath sounds: Normal breath sounds.  Abdominal:     General: There is no distension.     Palpations: Abdomen is soft. There is no mass.     Tenderness: There is no abdominal tenderness.     Hernia: There is no hernia in the left inguinal area or right inguinal area.  Musculoskeletal:        General: Normal range of motion.     Cervical back: Normal range of motion and neck supple.     Right lower leg: No edema.     Left lower leg: No edema.  Skin:    General: Skin is warm and dry.  Neurological:     General: No focal deficit present.     Mental Status: He is alert and oriented to person, place, and time. Mental status is at baseline.  Psychiatric:        Mood and Affect: Mood normal.        Behavior: Behavior normal.         Assessment & Plan:   Annual physical exam -     CMP14+EGFR  Screening for deficiency anemia -     CBC with Differential/Platelet  Screening for lipid disorders -     Lipid panel  Need for hepatitis C screening test     No follow-ups on file.   Arlo Lama, MD

## 2023-10-09 LAB — CBC WITH DIFFERENTIAL/PLATELET
Basophils Absolute: 0.1 10*3/uL (ref 0.0–0.2)
Basos: 1 %
EOS (ABSOLUTE): 0.2 10*3/uL (ref 0.0–0.4)
Eos: 3 %
Hematocrit: 47.7 % (ref 37.5–51.0)
Hemoglobin: 16.2 g/dL (ref 13.0–17.7)
Immature Grans (Abs): 0 10*3/uL (ref 0.0–0.1)
Immature Granulocytes: 0 %
Lymphocytes Absolute: 1.9 10*3/uL (ref 0.7–3.1)
Lymphs: 28 %
MCH: 32 pg (ref 26.6–33.0)
MCHC: 34 g/dL (ref 31.5–35.7)
MCV: 94 fL (ref 79–97)
Monocytes Absolute: 0.6 10*3/uL (ref 0.1–0.9)
Monocytes: 9 %
Neutrophils Absolute: 4.1 10*3/uL (ref 1.4–7.0)
Neutrophils: 59 %
Platelets: 281 10*3/uL (ref 150–450)
RBC: 5.07 x10E6/uL (ref 4.14–5.80)
RDW: 13 % (ref 11.6–15.4)
WBC: 7 10*3/uL (ref 3.4–10.8)

## 2023-10-09 LAB — LIPID PANEL
Chol/HDL Ratio: 4.1 ratio (ref 0.0–5.0)
Cholesterol, Total: 134 mg/dL (ref 100–199)
HDL: 33 mg/dL — ABNORMAL LOW (ref >39)
LDL Chol Calc (NIH): 78 mg/dL (ref 0–99)
Triglycerides: 129 mg/dL (ref 0–149)
VLDL Cholesterol Cal: 23 mg/dL (ref 5–40)

## 2023-10-09 LAB — CMP14+EGFR
ALT: 29 IU/L (ref 0–44)
AST: 27 IU/L (ref 0–40)
Albumin: 4.8 g/dL (ref 4.1–5.1)
Alkaline Phosphatase: 90 IU/L (ref 44–121)
BUN/Creatinine Ratio: 20 (ref 9–20)
BUN: 21 mg/dL — ABNORMAL HIGH (ref 6–20)
Bilirubin Total: 0.5 mg/dL (ref 0.0–1.2)
CO2: 23 mmol/L (ref 20–29)
Calcium: 9.2 mg/dL (ref 8.7–10.2)
Chloride: 100 mmol/L (ref 96–106)
Creatinine, Ser: 1.03 mg/dL (ref 0.76–1.27)
Globulin, Total: 2.7 g/dL (ref 1.5–4.5)
Glucose: 71 mg/dL (ref 70–99)
Potassium: 4 mmol/L (ref 3.5–5.2)
Sodium: 139 mmol/L (ref 134–144)
Total Protein: 7.5 g/dL (ref 6.0–8.5)
eGFR: 96 mL/min/{1.73_m2} (ref >59)

## 2023-10-09 LAB — HEPATITIS C ANTIBODY: Hep C Virus Ab: NONREACTIVE

## 2023-10-13 ENCOUNTER — Ambulatory Visit: Payer: Self-pay | Admitting: Family Medicine

## 2023-10-16 ENCOUNTER — Other Ambulatory Visit: Payer: Self-pay | Admitting: Family Medicine

## 2023-10-24 ENCOUNTER — Other Ambulatory Visit: Payer: Self-pay | Admitting: Family Medicine

## 2023-10-24 DIAGNOSIS — I10 Essential (primary) hypertension: Secondary | ICD-10-CM

## 2024-02-18 ENCOUNTER — Other Ambulatory Visit: Payer: Self-pay | Admitting: Family Medicine

## 2024-02-18 DIAGNOSIS — I10 Essential (primary) hypertension: Secondary | ICD-10-CM

## 2024-04-10 ENCOUNTER — Encounter: Payer: Self-pay | Admitting: Family Medicine

## 2024-04-10 ENCOUNTER — Ambulatory Visit (INDEPENDENT_AMBULATORY_CARE_PROVIDER_SITE_OTHER): Admitting: Family Medicine

## 2024-04-10 VITALS — BP 137/83 | HR 58 | Ht 75.0 in | Wt 207.6 lb

## 2024-04-10 DIAGNOSIS — R21 Rash and other nonspecific skin eruption: Secondary | ICD-10-CM

## 2024-04-10 DIAGNOSIS — I10 Essential (primary) hypertension: Secondary | ICD-10-CM

## 2024-04-12 ENCOUNTER — Encounter: Payer: Self-pay | Admitting: Family Medicine

## 2024-04-12 NOTE — Progress Notes (Signed)
 Established Patient Office Visit  Subjective    Patient ID: Jimmy Ibarra, male    DOB: July 16, 1985  Age: 38 y.o. MRN: [id number]  CC:  Chief Complaint  Patient presents with   Medical Management of Chronic Issues    Pt requesting dermatology recommendation     HPI Jimmy Ibarra presents for follow up of hypertension. Patient also complaint of a persistent red rash on his face for which he would like a referral to dermatology.   Outpatient Encounter Medications as of 04/10/2024  Medication Sig   amLODipine  (NORVASC ) 10 MG tablet TAKE 1 TABLET BY MOUTH EVERY DAY   losartan  (COZAAR ) 50 MG tablet TAKE 1 TABLET BY MOUTH EVERY DAY   spironolactone  (ALDACTONE ) 50 MG tablet TAKE 1 TABLET BY MOUTH EVERY DAY   albuterol  (VENTOLIN  HFA) 108 (90 Base) MCG/ACT inhaler Inhale 1-2 puffs into the lungs every 6 (six) hours as needed for wheezing or shortness of breath (cough). (Patient not taking: Reported on 04/10/2024)   benzonatate  (TESSALON ) 100 MG capsule Take 1 capsule (100 mg total) by mouth 3 (three) times daily as needed for cough.   No facility-administered encounter medications on file as of 04/10/2024.    Past Medical History:  Diagnosis Date   ADHD    Autism spectrum disorder    Depressive disorder    History of Shoulder dislocation    Hypertension    [employer] 02/26/2020    Past Surgical History:  Procedure Laterality Date   FRACTURE SURGERY      Family History  Problem Relation Age of Onset   Hypertension Neg Hx    Cancer Neg Hx    Heart disease Neg Hx    Stroke Neg Hx     Social History   Socioeconomic History   Marital status: Single    Spouse name: Not on file   Number of children: Not on file   Years of education: Not on file   Highest education level: Bachelor's degree (e.g., BA, AB, BS)  Occupational History   Not on file  Tobacco Use   Smoking status: Never    Passive exposure: Never   Smokeless tobacco: Never  Substance and Sexual Activity    Alcohol use: Yes    Comment: socially   Drug use: No   Sexual activity: Not on file  Other Topics Concern   Not on file  Social History Narrative   Not on file   Social Drivers of Health   Financial Resource Strain: Patient Declined (04/07/2024)   Overall Financial Resource Strain (CARDIA)    Difficulty of Paying Living Expenses: Patient declined  Food Insecurity: Patient Declined (04/07/2024)   Hunger Vital Sign    Worried About Running Out of Food in the Last Year: Patient declined    Ran Out of Food in the Last Year: Patient declined  Transportation Needs: No Transportation Needs (04/07/2024)   PRAPARE - Administrator, Civil Service (Medical): No    Lack of Transportation (Non-Medical): No  Physical Activity: Sufficiently Active (04/07/2024)   Exercise Vital Sign    Days of Exercise per Week: 5 days    Minutes of Exercise per Session: 60 min  Stress: No Stress Concern Present (04/07/2024)   [employer] of Occupational Health - Occupational Stress Questionnaire    Feeling of Stress: Only a little  Social Connections: Unknown (04/07/2024)   Social Connection and Isolation Panel    Frequency of Communication with Friends and Family: Patient declined  Frequency of Social Gatherings with Friends and Family: Patient declined    Attends Religious Services: Patient declined    Active Member of Clubs or Organizations: No    Attends Engineer, Structural: Not on file    Marital Status: Never married  Intimate Partner Violence: Not on file    Review of Systems  All other systems reviewed and are negative.       Objective    BP 137/83   Pulse (!) 58   Ht 6' 3 (1.905 m)   Wt 207 lb 9.6 oz (94.2 kg)   SpO2 95%   BMI 25.95 kg/m   Physical Exam Vitals and nursing note reviewed.  Constitutional:      General: He is not in acute distress. Cardiovascular:     Rate and Rhythm: Normal rate and regular rhythm.  Pulmonary:     Effort: Pulmonary  effort is normal.     Breath sounds: Normal breath sounds.  Abdominal:     Palpations: Abdomen is soft.     Tenderness: There is no abdominal tenderness.  Skin:    Findings: Rash present.  Neurological:     General: No focal deficit present.     Mental Status: He is alert and oriented to person, place, and time.         Assessment & Plan:   Facial rash -     Ambulatory referral to Dermatology  Essential hypertension     Return in about 6 months (around 10/08/2024) for physical.   Tanda Raguel SQUIBB, MD

## 2024-06-28 ENCOUNTER — Other Ambulatory Visit: Payer: Self-pay | Admitting: Family Medicine

## 2024-06-28 DIAGNOSIS — I10 Essential (primary) hypertension: Secondary | ICD-10-CM

## 2024-10-16 ENCOUNTER — Encounter: Admitting: Family Medicine
